# Patient Record
Sex: Male | Born: 1975 | Race: White | Hispanic: No | Marital: Married | Smoking: Current every day smoker
Health system: Southern US, Community
[De-identification: ages and names within clinical notes are randomized; demographics above are authoritative.]

## PROBLEM LIST (undated history)

## (undated) DIAGNOSIS — J9819 Other pulmonary collapse: Secondary | ICD-10-CM

## (undated) HISTORY — PX: APPENDECTOMY: SHX54

## (undated) HISTORY — PX: KNEE SURGERY: SHX244

## (undated) HISTORY — PX: JOINT REPLACEMENT: SHX530

---

## 2004-03-19 ENCOUNTER — Emergency Department (HOSPITAL_COMMUNITY): Admission: EM | Admit: 2004-03-19 | Discharge: 2004-03-19 | Payer: Self-pay | Admitting: Emergency Medicine

## 2004-03-24 ENCOUNTER — Emergency Department (HOSPITAL_COMMUNITY): Admission: EM | Admit: 2004-03-24 | Discharge: 2004-03-24 | Payer: Self-pay | Admitting: Emergency Medicine

## 2004-03-26 ENCOUNTER — Emergency Department (HOSPITAL_COMMUNITY): Admission: EM | Admit: 2004-03-26 | Discharge: 2004-03-26 | Payer: Self-pay | Admitting: Emergency Medicine

## 2004-03-27 ENCOUNTER — Emergency Department (HOSPITAL_COMMUNITY): Admission: EM | Admit: 2004-03-27 | Discharge: 2004-03-27 | Payer: Self-pay

## 2004-04-06 ENCOUNTER — Emergency Department (HOSPITAL_COMMUNITY): Admission: EM | Admit: 2004-04-06 | Discharge: 2004-04-06 | Payer: Self-pay | Admitting: Emergency Medicine

## 2004-12-30 ENCOUNTER — Emergency Department (HOSPITAL_COMMUNITY): Admission: EM | Admit: 2004-12-30 | Discharge: 2004-12-30 | Payer: Self-pay | Admitting: Emergency Medicine

## 2005-02-05 ENCOUNTER — Emergency Department (HOSPITAL_COMMUNITY): Admission: EM | Admit: 2005-02-05 | Discharge: 2005-02-05 | Payer: Self-pay | Admitting: Emergency Medicine

## 2005-02-07 ENCOUNTER — Emergency Department (HOSPITAL_COMMUNITY): Admission: EM | Admit: 2005-02-07 | Discharge: 2005-02-07 | Payer: Self-pay | Admitting: Family Medicine

## 2007-04-12 ENCOUNTER — Emergency Department (HOSPITAL_COMMUNITY): Admission: EM | Admit: 2007-04-12 | Discharge: 2007-04-12 | Payer: Self-pay | Admitting: Emergency Medicine

## 2007-04-22 ENCOUNTER — Emergency Department (HOSPITAL_COMMUNITY): Admission: EM | Admit: 2007-04-22 | Discharge: 2007-04-22 | Payer: Self-pay | Admitting: Emergency Medicine

## 2008-11-27 ENCOUNTER — Ambulatory Visit: Payer: Self-pay | Admitting: Family Medicine

## 2008-11-27 DIAGNOSIS — L509 Urticaria, unspecified: Secondary | ICD-10-CM | POA: Insufficient documentation

## 2008-11-30 ENCOUNTER — Emergency Department (HOSPITAL_COMMUNITY): Admission: EM | Admit: 2008-11-30 | Discharge: 2008-11-30 | Payer: Self-pay | Admitting: Family Medicine

## 2008-11-30 LAB — CONVERTED CEMR LAB
ALT: 15 units/L (ref 0–53)
Alkaline Phosphatase: 61 units/L (ref 39–117)
Basophils Absolute: 0 10*3/uL (ref 0.0–0.1)
Basophils Relative: 0 % (ref 0–1)
MCHC: 32.9 g/dL (ref 30.0–36.0)
Neutro Abs: 6.8 10*3/uL (ref 1.7–7.7)
Neutrophils Relative %: 54 % (ref 43–77)
Platelets: 306 10*3/uL (ref 150–400)
RDW: 13.8 % (ref 11.5–15.5)
Sodium: 141 meq/L (ref 135–145)
Total Bilirubin: 0.3 mg/dL (ref 0.3–1.2)
Total Protein: 6.7 g/dL (ref 6.0–8.3)

## 2009-07-26 ENCOUNTER — Ambulatory Visit: Payer: Self-pay | Admitting: Emergency Medicine

## 2009-07-26 DIAGNOSIS — IMO0002 Reserved for concepts with insufficient information to code with codable children: Secondary | ICD-10-CM | POA: Insufficient documentation

## 2009-07-26 DIAGNOSIS — M5380 Other specified dorsopathies, site unspecified: Secondary | ICD-10-CM | POA: Insufficient documentation
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
## 2010-04-26 LAB — POCT RAPID STREP A (OFFICE): Streptococcus, Group A Screen (Direct): NEGATIVE

## 2010-07-05 ENCOUNTER — Other Ambulatory Visit: Payer: Self-pay | Admitting: Orthopedic Surgery

## 2010-07-05 ENCOUNTER — Encounter (HOSPITAL_COMMUNITY): Payer: BC Managed Care – PPO

## 2010-07-05 LAB — SURGICAL PCR SCREEN: Staphylococcus aureus: NEGATIVE

## 2010-07-05 LAB — COMPREHENSIVE METABOLIC PANEL
ALT: 18 U/L (ref 0–53)
AST: 14 U/L (ref 0–37)
Albumin: 4.2 g/dL (ref 3.5–5.2)
Alkaline Phosphatase: 89 U/L (ref 39–117)
Calcium: 9.6 mg/dL (ref 8.4–10.5)
Potassium: 3.9 mEq/L (ref 3.5–5.1)
Sodium: 139 mEq/L (ref 135–145)
Total Protein: 6.7 g/dL (ref 6.0–8.3)

## 2010-07-05 LAB — CBC
HCT: 45.5 % (ref 39.0–52.0)
Hemoglobin: 15.1 g/dL (ref 13.0–17.0)
MCHC: 33.2 g/dL (ref 30.0–36.0)
RBC: 4.84 MIL/uL (ref 4.22–5.81)

## 2010-07-05 LAB — URINALYSIS, ROUTINE W REFLEX MICROSCOPIC
Glucose, UA: NEGATIVE mg/dL
Hgb urine dipstick: NEGATIVE
Protein, ur: NEGATIVE mg/dL
Specific Gravity, Urine: 1.014 (ref 1.005–1.030)
pH: 6 (ref 5.0–8.0)

## 2010-07-05 LAB — DIFFERENTIAL
Basophils Absolute: 0 10*3/uL (ref 0.0–0.1)
Lymphocytes Relative: 17 % (ref 12–46)
Monocytes Absolute: 0.9 10*3/uL (ref 0.1–1.0)
Neutro Abs: 9.2 10*3/uL — ABNORMAL HIGH (ref 1.7–7.7)
Neutrophils Relative %: 73 % (ref 43–77)

## 2010-07-05 LAB — APTT: aPTT: 29 seconds (ref 24–37)

## 2010-07-05 LAB — PROTIME-INR
INR: 0.96 (ref 0.00–1.49)
Prothrombin Time: 13 seconds (ref 11.6–15.2)

## 2010-07-06 ENCOUNTER — Inpatient Hospital Stay (HOSPITAL_COMMUNITY)
Admission: RE | Admit: 2010-07-06 | Discharge: 2010-07-08 | DRG: 209 | Disposition: A | Discharge status: Home Health | Payer: BC Managed Care – PPO | Source: Ambulatory Visit | Attending: Orthopedic Surgery | Admitting: Orthopedic Surgery

## 2010-07-06 DIAGNOSIS — Z9889 Other specified postprocedural states: Secondary | ICD-10-CM

## 2010-07-06 DIAGNOSIS — F411 Generalized anxiety disorder: Secondary | ICD-10-CM | POA: Diagnosis present

## 2010-07-06 DIAGNOSIS — D62 Acute posthemorrhagic anemia: Secondary | ICD-10-CM | POA: Diagnosis not present

## 2010-07-06 DIAGNOSIS — M12569 Traumatic arthropathy, unspecified knee: Principal | ICD-10-CM | POA: Diagnosis present

## 2010-07-06 DIAGNOSIS — F17200 Nicotine dependence, unspecified, uncomplicated: Secondary | ICD-10-CM | POA: Diagnosis present

## 2010-07-06 DIAGNOSIS — Z01812 Encounter for preprocedural laboratory examination: Secondary | ICD-10-CM

## 2010-07-06 DIAGNOSIS — K219 Gastro-esophageal reflux disease without esophagitis: Secondary | ICD-10-CM | POA: Diagnosis present

## 2010-07-06 DIAGNOSIS — M948X9 Other specified disorders of cartilage, unspecified sites: Secondary | ICD-10-CM | POA: Diagnosis present

## 2010-07-07 DIAGNOSIS — M79609 Pain in unspecified limb: Secondary | ICD-10-CM

## 2010-07-07 LAB — BASIC METABOLIC PANEL
BUN: 5 mg/dL — ABNORMAL LOW (ref 6–23)
CO2: 29 mEq/L (ref 19–32)
Chloride: 101 mEq/L (ref 96–112)
Creatinine, Ser: 0.79 mg/dL (ref 0.50–1.35)
GFR calc Af Amer: >60 mL/min (ref >60)
Potassium: 3.7 mEq/L (ref 3.5–5.1)

## 2010-07-07 LAB — CBC
HCT: 33.6 % — ABNORMAL LOW (ref 39.0–52.0)
MCV: 96.3 fL (ref 78.0–100.0)
RBC: 3.49 MIL/uL — ABNORMAL LOW (ref 4.22–5.81)
WBC: 14 10*3/uL — ABNORMAL HIGH (ref 4.0–10.5)

## 2010-07-08 LAB — CBC
HCT: 33.2 % — ABNORMAL LOW (ref 39.0–52.0)
Hemoglobin: 10.9 g/dL — ABNORMAL LOW (ref 13.0–17.0)
MCHC: 32.8 g/dL (ref 30.0–36.0)
MCV: 96.5 fL (ref 78.0–100.0)
RDW: 13.2 % (ref 11.5–15.5)
WBC: 11.9 10*3/uL — ABNORMAL HIGH (ref 4.0–10.5)

## 2010-07-08 LAB — BASIC METABOLIC PANEL
BUN: 6 mg/dL (ref 6–23)
Chloride: 101 mEq/L (ref 96–112)
Creatinine, Ser: 0.87 mg/dL (ref 0.50–1.35)
GFR calc Af Amer: >60 mL/min (ref >60)
Glucose, Bld: 93 mg/dL (ref 70–99)
Potassium: 4.7 mEq/L (ref 3.5–5.1)

## 2010-07-09 LAB — CROSSMATCH: Unit division: 0

## 2010-08-09 NOTE — Op Note (Signed)
NAME:  ROSCOE, WITTS NO.:  [id number]  MEDICAL RECORD NO.:  [id number]  LOCATION:  1621                         FACILITY:  [facility]  PHYSICIAN:  Trentan Trippe L. Rendall, M.D.  DATE OF BIRTH:  [birth year]  DATE OF PROCEDURE:  07/06/2010 DATE OF DISCHARGE:                              OPERATIVE REPORT   PREOPERATIVE DIAGNOSIS:  Osteoarthritis, left knee status post seven procedures.  SURGICAL PROCEDURES:  Left total knee arthroplasty with computer navigation assistance plus removal of one metal screw, 2 bile screws and significant old surgical debris.  POSTOPERATIVE DIAGNOSIS:  Osteoarthritis, left knee status post seven procedures.  SURGEON:  Samyrah Bruster L. Rendall, M.D.  ASSISTANT:  Legrand Pitts. Duffy, P.A.C - present and participating in entire procedure.  PATHOLOGY:  The patient has had 7 surgical procedures on his left knee including 2 ACL reconstructions and a meniscal allograft at the medial side of his knee.  He has pain that is an 8 or 9 out of 10 requiring significant narcotic use.  Recent x-ray show bone against bone medial compartment.  The meniscal allograft is compressed and is not holding the bones apart.  At surgery, there is grade 3 changes with virtual bone against bone medially and grade 2 and 3 changes on the lateral femoral condyle and patella with chronic inflammation throughout the knee.  In addition in placement of the bone tunnels and so forth one metal screw was removed from the tibial metaphysis and 2 bile screws along with at least a dozen Tycron sutures used for either ACL reconstruction or meniscal allograft or any combination thereof  PROCEDURE:  Under general anesthesia with femoral nerve block, the left leg was prepared with DuraPrep and draped in a sterile field.  Sterile tourniquet was applied proximally using the tourniquet at 350 mmHg after wrapping out the leg with an Esmarch.  Midline incision was made.  The patella was everted.  The  knee is debrided in preparation for computer navigation.  The above findings were noted.  Two Schanz pins were then placed through punctures in the medial tibial metaphyseal region, two in the distal femur within the incision.  The arrays were set up.  The femoral head and malleoli were identified.  Proximal tibia and distal femur mapping were then done.  With the arrays in place, proximal tibial resection was then done resecting the proximal tibia within 1 degree of anatomic accuracy.  The tensioner was then inserted to assure balance ligaments and these were obtained within 1 degree of anatomic accuracy. The flexion gap was then measured.  With this done, the first femoral guide was used for anterior and posterior flare of the distal femur sized at a standard plus.  The distal femoral cut was then made. Flexion and extension gaps were balanced at approximately 10 mm.  Once these 2 cuts were done, the lamina spreader was inserted.  Remnants of the menisci including the allograft which was well fixed but just compressed, remnants of the menisci were debrided and spurs were taken off the back femoral condyle along with the cruciate ligaments.  The recessing guide was then used for a standard plus.  Attention was then turned to the tibia.  It was sized as a #4.  Center peg hole for an MBT bearing was inserted.  The drill went down three-quarters of the way where it encountered metal screw.  We had palpated the medial tibial metaphysis feeling for screw head, thus we exposed the proximal tibia medially and could not find it; however, considerable number of Tycron sutures were removed.  At this point the tibial tray was removed, the screw was palpated.  The incision was carried down another 1 inch in the midline and the screw head was identified just medial to the tibial spine, it was then removed.  In addition down the same hole in the tibia, 2 bile screws were found, they were taken loose  and removed.  The completion of drilling for a mobile bearing tray revision tray was then completed.  The final seating was excellent.  Attention was then turned to putting all the parts together and testing the prosthesis.  The standard plus bearing 10 was used followed by 12.5 followed by 15.  With the 15, the knee in flexion would kick the femoral component off the femur.  This was felt to ultimately be a result of components being too tight posterior medially.  Attention was turned to this.  Further bone was taken off the back of the femoral condyle.  Further trimming of bone was done trying to get a better fit.  Ultimately this did not seem to work and decision was made to downsize the femoral component which was at the upper limits of good as it fit on the end of the femur very well but was just slightly broad at the flare of the femur just in front of the weightbearing area.  Downsizing required also downsizing of the tibial tray to a #3 and with a downsize to a #3 tibia, a 17.5 bearing gave optimal fit with the standard femoral component.  A standard patellar button was then used.  At this point all components fit well. The leg was within 1 degree of anatomic alignment and flexed 120+ degrees.  Permanent components were then obtained.  Bony surfaces prepared with pulse irrigation.  All components were then cemented in place.  The excess cement was removed.  Once it hardened and tourniquet was let down at approximately 90 minutes, multiple small vessels were cauterized.  The knee was then closed in layers with #1 Tycron, #1 Vicryl, 2-0 Vicryl and skin clips.  Operative time, approximately an hour and 45 minutes.  The patient tolerated the procedure well and returned to recovery in good condition.     Braxtin Bamba L. Priscille Kluver, M.D.     Renato Gails  D:  07/06/2010  T:  07/06/2010  Job:  [id number]  Electronically Signed by Erasmo Leventhal M.D. on 08/09/2010 09:59:29 AM
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
## 2010-10-16 LAB — D-DIMER, QUANTITATIVE: D-Dimer, Quant: <0.22

## 2010-10-16 LAB — SEDIMENTATION RATE: Sed Rate: 2

## 2010-11-02 ENCOUNTER — Inpatient Hospital Stay (INDEPENDENT_AMBULATORY_CARE_PROVIDER_SITE_OTHER)
Admission: RE | Admit: 2010-11-02 | Discharge: 2010-11-02 | Disposition: A | Discharge status: Home / Self Care | Payer: BC Managed Care – PPO | Source: Ambulatory Visit | Attending: Emergency Medicine | Admitting: Emergency Medicine

## 2010-11-02 ENCOUNTER — Encounter: Payer: Self-pay | Admitting: Emergency Medicine

## 2010-11-02 DIAGNOSIS — J301 Allergic rhinitis due to pollen: Secondary | ICD-10-CM

## 2010-11-02 DIAGNOSIS — G43009 Migraine without aura, not intractable, without status migrainosus: Secondary | ICD-10-CM | POA: Insufficient documentation

## 2010-12-25 NOTE — Progress Notes (Signed)
Summary: SINUS ISSUES,NAUSEA,VOMITING...WSE   Vital Signs:  Patient Profile:   35 Years Old Male CC:      Migraine with nausea/sinuses x 5 days Height:     69 inches Weight:      161 pounds O2 Sat:      99 % O2 treatment:    Room Air Temp:     98.6 degrees F oral Pulse rate:   78 / minute Pulse rhythm:   regular Resp:     16 per minute BP sitting:   152 / 82  (left arm) Cuff size:   regular  Vitals Entered By: Emilio Math (November 02, 2010 1:13 PM)                  Current Allergies: No known allergies History of Present Illness History from: patient Chief Complaint: Migraine with nausea/sinuses x 5 days History of Present Illness: Pt complains of  5 days of bilateral frontal-maxillary-parietal headache, worse on the left, with sinus congestion. No colored mucus. He feels this is similar to a migraine he had 1 year ago, because it feels the same, has + photophobia, nausea. Had vom x 3 yesterday, none today. Pain 6/10. dull. Not the worst headache of his life. No associated neuro sxs. No blurry vision. No syncope. Tried Zyrttec with minimal help. Tylenol no help.EXCEDRIN MIGRAINE: no help In the past, "Zofran gives me a headache" No sore throat. No cough, No dyspnea. No chest pain. No wheezing.  No fever, No chills   Current Meds EXCEDRIN MIGRAINE 250-250-65 MG TABS (ASPIRIN-ACETAMINOPHEN-CAFFEINE)  ZYRTEC ALLERGY 10 MG CAPS (CETIRIZINE HCL)  PROMETHAZINE HCL 25 MG TABS (PROMETHAZINE HCL) 1 by mouth q4 to 6hr as needed nausea FLUTICASONE PROPIONATE 50 MCG/ACT SUSP (FLUTICASONE PROPIONATE) 2 sprays in each nostril once daily IMITREX 50 MG TABS (SUMATRIPTAN SUCCINATE) 1 by mouth stat as needed for migraine. May repeat X 1 after 2 hours if needed.  REVIEW OF SYSTEMS Constitutional Symptoms      Denies fever, chills, night sweats, weight loss, weight gain, and fatigue.  Eyes       Complains of eye pain.      Denies change in vision, eye discharge, glasses, contact  lenses, and eye surgery. Ear/Nose/Throat/Mouth       Complains of frequent runny nose and sinus problems.      Denies hearing loss/aids, change in hearing, ear pain, ear discharge, dizziness, frequent nose bleeds, sore throat, hoarseness, and tooth pain or bleeding.  Respiratory       Denies dry cough, productive cough, wheezing, shortness of breath, asthma, bronchitis, and emphysema/COPD.  Cardiovascular       Denies murmurs, chest pain, and tires easily with exhertion.    Gastrointestinal       Complains of nausea/vomiting.      Denies stomach pain, diarrhea, constipation, blood in bowel movements, and indigestion. Genitourniary       Denies painful urination, kidney stones, and loss of urinary control. Neurological       Complains of headaches.      Denies paralysis, seizures, and fainting/blackouts. Musculoskeletal       Denies muscle pain, joint pain, joint stiffness, decreased range of motion, redness, swelling, muscle weakness, and gout.  Skin       Denies bruising, unusual mles/lumps or sores, and hair/skin or nail changes.  Psych       Denies mood changes, temper/anger issues, anxiety/stress, speech problems, depression, and sleep problems.  Past History:  Past Medical History: Reviewed  history from 11/27/2008 and no changes required. Unremarkable  Past Surgical History: Reviewed history from 11/27/2008 and no changes required. Appendectomy 5 Knee sugeries  Family History: Reviewed history from 11/27/2008 and no changes required. Mother, Healthy Father, Healthy  Social History: Reviewed history from 11/27/2008 and no changes required. 1 ppd , smoker, 15 yrs ETOH-yes No drugs [place] Physical Exam General appearance: well developed, well nourished, no acute distress. Uncomfortable from HA. + photophobic. Head: normocephalic, atraumatic, Eyes: conjunctivae and lids normal Pupils: equal, round, reactive to light. Fundi wnl. Ears: normal, no lesions or  deformities. Normal tm's Nasal: swollen red turbinates with congestion. No discharge. Oral/Pharynx: tongue normal, posterior pharynx without erythema or exudate Neck: neck supple,  trachea midline, no masses Chest/Lungs: no rales, wheezes, or rhonchi bilateral, breath sounds equal without effort Heart: regular rate and  rhythm, no murmur Extremities: normal extremities Neurological: cranial nerve II-XII grossly intact and non-focal. Motor, sensory, DTR's wnl. 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 May repeat X 1 after 2 hours if needed.  #4 x 0, 11/02/2010, Lajean Manes MD FLUTICASONE PROPIONATE 50 MCG/ACT SUSP (FLUTICASONE PROPIONATE) 2 sprays in each nostril once daily  #One x 0, 11/02/2010, Lajean Manes MD PROMETHAZINE HCL 25 MG TABS (PROMETHAZINE HCL) 1 by mouth q4 to 6hr as needed nausea  #12 x 0, 11/02/2010, Lajean Manes MD  New Orders: Ketorolac-Toradol 15mg  [Z6109] Promethazine up to 50mg  [J2550] Est. Patient Level IV [60454] Admin of Therapeutic Inj  intramuscular or subcutaneous [96372] Planning Comments:   Toradol 60 mg  and Phenergan 50 mg IM. A family member came to drive him home. Rx's for by mouth phenergan (as needed nausea), Flonase for sinus allergies and sinus pressure, and Imitrex, with precautions explained and discussed prior to injections. Risks, benefits, alternatives discussed. Pt voiced understanding and agreement.  Follow Up: Follow up in 1-2 days if no improvement, Follow up with Primary Physician Follow Up: Neurologist in 1 week if headaches persist. F/U to a hospital ED if severe headaches persist or recrr or if new or worsening sxs.  The patient and/or caregiver has been counseled thoroughly with  regard to medications prescribed including dosage, schedule, interactions, rationale for use, and possible side effects and they verbalize understanding.  Diagnoses and expected course of recovery discussed and will return if not improved as expected or if the condition worsens. Patient and/or caregiver verbalized understanding.  Prescriptions: IMITREX 50 MG TABS (SUMATRIPTAN SUCCINATE) 1 by mouth stat as needed for migraine. May repeat X 1 after 2 hours if needed.  #4 x 0   Entered and Authorized by:   Lajean Manes MD   Signed by:   Lajean Manes MD on 11/02/2010   Method used:   Print then Give to Patient   RxID:   [id number] FLUTICASONE PROPIONATE 50 MCG/ACT SUSP (FLUTICASONE PROPIONATE) 2 sprays in each nostril once daily  #One x 0   Entered and Authorized by:   Lajean Manes MD   Signed by:   Lajean Manes MD on 11/02/2010   Method used:   Print then Give to Patient   RxID:   [id number] PROMETHAZINE HCL 25 MG TABS (PROMETHAZINE HCL) 1 by mouth q4 to 6hr as needed nausea  #12 x 0   Entered and Authorized by:  Lajean Manes MD   Signed by:   Lajean Manes MD on 11/02/2010   Method used:   Print then Give to Patient   RxID:   [id number]   Medication Administration  Injection # 1:    Medication: Ketorolac-Toradol 15mg     Diagnosis: 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.10)    Route: IM    Site: LUOQ gluteus    Exp Date: 04/22/2012    Lot #: [id number]    Mfr: Hospira    Comments: 60mg s given    Patient tolerated injection without complications    Given by: Emilio Math (November 02, 2010 2:37 PM)  Injection # 2:    Medication: Promethazine up to 50mg     Diagnosis: 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 Patient Level IV [40102] 4)  Admin of Therapeutic Inj  intramuscular or subcutaneous [72536]

## 2011-02-06 ENCOUNTER — Ambulatory Visit: Payer: BC Managed Care – PPO | Admitting: Physical Medicine & Rehabilitation

## 2011-03-31 ENCOUNTER — Emergency Department
Admission: EM | Admit: 2011-03-31 | Discharge: 2011-03-31 | Disposition: A | Discharge status: Home / Self Care | Payer: BC Managed Care – PPO | Source: Home / Self Care | Attending: Emergency Medicine | Admitting: Emergency Medicine

## 2011-03-31 DIAGNOSIS — F419 Anxiety disorder, unspecified: Secondary | ICD-10-CM

## 2011-03-31 DIAGNOSIS — J069 Acute upper respiratory infection, unspecified: Secondary | ICD-10-CM

## 2011-03-31 DIAGNOSIS — J029 Acute pharyngitis, unspecified: Secondary | ICD-10-CM

## 2011-03-31 DIAGNOSIS — F411 Generalized anxiety disorder: Secondary | ICD-10-CM

## 2011-03-31 LAB — POCT RAPID STREP A (OFFICE): Rapid Strep A Screen: NEGATIVE

## 2011-03-31 MED ORDER — PREDNISONE (PAK) 10 MG PO TABS: 10.0000 mg | ORAL_TABLET | Freq: Every day | ORAL | Status: AC

## 2011-03-31 MED ORDER — ALPRAZOLAM 0.25 MG PO TABS: 0.2500 mg | ORAL_TABLET | Freq: Every evening | ORAL | Status: AC | PRN

## 2011-03-31 NOTE — ED Notes (Signed)
Cough and sore throat started this am

## 2011-03-31 NOTE — ED Provider Notes (Signed)
History     CSN: [id number]  Arrival date & time 03/31/11  1119   First MD Initiated Contact with Patient 03/31/11 1148      Chief Complaint  Patient presents with  . Cough    (Consider location/radiation/quality/duration/timing/severity/associated sxs/prior treatment) HPI Clifford Miller is a 36 y.o. male who complains of onset of cold symptoms for 2  days. +Sick contacts in his family. ++ sore throat + cough No pleuritic pain No wheezing + nasal congestion +post-nasal drainage + sinus pain/pressure No chest congestion No itchy/red eyes No earache No hemoptysis No SOB No chills/sweats No fever No nausea No vomiting No abdominal pain No diarrhea No skin rashes No fatigue No myalgias No headache   2) the patient is preparing to fly to [place] for business and will be there for 5 days. 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 He is asking if he can have a prescription. He states that he normally only uses it for flying and occasionally for very stressful situations.   History reviewed. No pertinent past medical history.  Past Surgical History  Procedure Date  . Knee surgery     x8- total knee    Family History  Problem Relation Age of Onset  . Cancer Other     History  Substance Use Topics  . Smoking status: Current Everyday Smoker  . Smokeless tobacco: Not on file  . Alcohol Use: Yes      Review of Systems  Allergies  Review of patient's allergies indicates no known allergies.  Home Medications   Current Outpatient Rx  Name Route Sig Dispense Refill  . ALPRAZOLAM 0.25 MG PO TABS Oral Take 1 tablet (0.25 mg total) by mouth at bedtime as needed for sleep. 10 tablet 0  . PREDNISONE (PAK) 10 MG PO TABS Oral Take 1 tablet (10 mg total) by mouth daily. 6 day pack, use as directed, Disp 1 pack 1 tablet 0    BP 116/82  Temp(Src)  98.1 F (36.7 C) (Oral)  Resp 20  Ht 5\' 9"  (1.753 m)  Wt 160 lb 4 oz (72.689 kg)  BMI 23.66 kg/m2  SpO2 98%  Physical Exam  ED Course  Procedures (including critical care time)   Labs Reviewed  POCT RAPID STREP A (OFFICE)  STREP A DNA PROBE   No results found.   1. Acute pharyngitis   2. Acute upper respiratory infections of unspecified site   3. Anxiety       MDM  1)  no antibiotic was prescribed today. A throat culture is pending. I gave him prescription for prednisone instead. Use nasal saline solution (over the counter) at least 3 times a day. Use over the counter decongestants like Zyrtec-D every 12 hours as needed to help with congestion.  If you have hypertension, do not take medicines with sudafed.  Can take tylenol every 6 hours or motrin every 8 hours for pain or fever. Follow up with your primary doctor if no improvement in 5-7 days, sooner if increasing pain, fever, or new symptoms.    2) I informed patient that we normally do not treat anxiety, however I would not like him to be carrying another's persons prescription for international travel since it may be confiscated. Because of that I gave him a prescription for Xanax 0.25 mg to be used specifically for flying if necessary. However  he'll need to get any refills from his primary care doctor.  Marlaine Hind, MD 03/31/11 [phone]

## 2011-04-01 LAB — STREP A DNA PROBE: GASP: NEGATIVE

## 2011-04-11 ENCOUNTER — Emergency Department
Admit: 2011-04-11 | Discharge: 2011-04-11 | Disposition: A | Discharge status: Home / Self Care | Payer: BC Managed Care – PPO

## 2011-04-11 ENCOUNTER — Encounter: Payer: Self-pay | Admitting: *Deleted

## 2011-04-11 ENCOUNTER — Emergency Department
Admission: EM | Admit: 2011-04-11 | Discharge: 2011-04-11 | Disposition: A | Discharge status: Home / Self Care | Payer: BC Managed Care – PPO | Source: Home / Self Care | Attending: Emergency Medicine | Admitting: Emergency Medicine

## 2011-04-11 DIAGNOSIS — J209 Acute bronchitis, unspecified: Secondary | ICD-10-CM

## 2011-04-11 DIAGNOSIS — R05 Cough: Secondary | ICD-10-CM

## 2011-04-11 DIAGNOSIS — R059 Cough, unspecified: Secondary | ICD-10-CM

## 2011-04-11 HISTORY — DX: Other pulmonary collapse: J98.19

## 2011-04-11 MED ORDER — HYDROCODONE-HOMATROPINE 5-1.5 MG/5ML PO SYRP: 5.0000 mL | ORAL_SOLUTION | Freq: Four times a day (QID) | ORAL | Status: AC | PRN

## 2011-04-11 MED ORDER — FEXOFENADINE-PSEUDOEPHED ER 60-120 MG PO TB12: 1.0000 | ORAL_TABLET | Freq: Two times a day (BID) | ORAL | Status: DC

## 2011-04-11 MED ORDER — CLARITHROMYCIN 500 MG PO TABS: 500.0000 mg | ORAL_TABLET | Freq: Two times a day (BID) | ORAL | Status: AC

## 2011-04-11 NOTE — ED Provider Notes (Signed)
History     CSN: [id number]  Arrival date & time 04/11/11  1125   First MD Initiated Contact with Patient 04/11/11 1146      Chief Complaint  Patient presents with  . Cough  . Sore Throat    (Consider location/radiation/quality/duration/timing/severity/associated sxs/prior treatment) HPI Clayborne is a 36 y.o. male who complains of onset of cold symptoms for 10 days. He was here about 10 days ago for similar symptoms and had a negative strep test and a negative culture. He was given prednisone which did not help very much. He then flew to [place] for his job and states that he was around a lot of sick people. In addition he has not been sleeping very much secondary to jet lag. He has not been using any medications. He thinks that since the last visit he is getting a little bit worse. He is concerned that he may have developed walking pneumonia while in [place].   + sore throat + cough No pleuritic pain + wheezing + nasal congestion + post-nasal drainage + sinus pain/pressure + chest congestion No itchy/red eyes No earache No hemoptysis No SOB No chills/sweats No fever No nausea No vomiting No abdominal pain No diarrhea No skin rashes + fatigue + myalgias No headache    Past Medical History  Diagnosis Date  . Collapsed lung     Past Surgical History  Procedure Date  . Knee surgery     x8- total knee    Family History  Problem Relation Age of Onset  . Cancer Other     History  Substance Use Topics  . Smoking status: Current Everyday Smoker -- 18 years    Types: Cigarettes  . Smokeless tobacco: Not on file  . Alcohol Use: Yes      Review of Systems  All other systems reviewed and are negative.    Allergies  Review of patient's allergies indicates no known allergies.  Home Medications   Current Outpatient Rx  Name Route Sig Dispense Refill  . ALPRAZOLAM 0.25 MG PO TABS Oral Take 1 tablet (0.25 mg total) by mouth at bedtime as needed for sleep. 10  tablet 0  . CLARITHROMYCIN 500 MG PO TABS Oral Take 1 tablet (500 mg total) by mouth 2 (two) times daily. 20 tablet 0  . FEXOFENADINE-PSEUDOEPHED ER 60-120 MG PO TB12 Oral Take 1 tablet by mouth every 12 (twelve) hours. 30 tablet 0  . HYDROCODONE-HOMATROPINE 5-1.5 MG/5ML PO SYRP Oral Take 5 mLs by mouth every 6 (six) hours as needed for cough. 120 mL 0  . PREDNISONE (PAK) 10 MG PO TABS Oral Take 1 tablet (10 mg total) by mouth daily. 6 day pack, use as directed, Disp 1 pack 1 tablet 0    BP 133/86  Pulse 106  Temp(Src) 98.2 F (36.8 C) (Oral)  Resp 16  Ht 5\' 9"  (1.753 m)  Wt 164 lb 12 oz (74.73 kg)  BMI 24.33 kg/m2  SpO2 100%  Physical Exam  Nursing note and vitals reviewed. Constitutional: He is oriented to person, place, and time. He appears well-developed and well-nourished.  Non-toxic appearance. He does not appear ill.  HENT:  Head: Normocephalic and atraumatic.  Right Ear: Tympanic membrane, external ear and ear canal normal.  Left Ear: Tympanic membrane, external ear and ear canal normal.  Nose: Mucosal edema and rhinorrhea present.  Mouth/Throat: Posterior oropharyngeal erythema present. No oropharyngeal exudate or posterior oropharyngeal edema.  Eyes: No scleral icterus.  Neck: Neck supple.  Cardiovascular:  Regular rhythm and normal heart sounds.   Pulmonary/Chest: Effort normal. No respiratory distress. He has no decreased breath sounds. He has wheezes (Scattered bilateral). He has rhonchi (very mild, scattered bilateral).  Neurological: He is alert and oriented to person, place, and time.  Skin: Skin is warm and dry.  Psychiatric: He has a normal mood and affect. His speech is normal.    ED Course  Procedures (including critical care time)  Labs Reviewed - No data to display Dg Chest 2 View  04/11/2011  *RADIOLOGY REPORT*  Clinical Data: Cough and congestion for 10 days.  Recent prednisone therapy.  Smoker.  CHEST - 2 VIEW  Comparison: 04/22/2007 and 04/12/2007.   Findings: The heart size and mediastinal contours are normal. The lungs are clear. There is no pleural effusion or pneumothorax. No acute osseous findings are identified.  Old fracture of the distal left clavicle appears stable.  IMPRESSION: Stable examination.  No active cardiopulmonary process.  Original Report Authenticated By: Gerrianne Scale, M.D.     1. Cough   2. Acute bronchitis       MDM   An x-ray was ordered and read by the radiologist as above.  Give him a prescription for Biaxin, Hydromet, and Allegra-D. I've advised him to get much rest as possible and to hydrate a since a lack of sleep has likely caused him to to postpone healing.  He should followup with his PCP if not improving in a week. Nasal saline.     Marlaine Hind, MD 04/11/11 [phone]

## 2011-04-11 NOTE — ED Notes (Signed)
Pt complains of sore throat cough body aches chest congestion for 10 days.  Was given Prednisone 10 days ago and has had no relief.  Pt stated he was in [place] last wk around sick people.

## 2011-04-12 ENCOUNTER — Telehealth: Payer: Self-pay | Admitting: *Deleted

## 2011-05-10 ENCOUNTER — Emergency Department (HOSPITAL_BASED_OUTPATIENT_CLINIC_OR_DEPARTMENT_OTHER)
Admission: EM | Admit: 2011-05-10 | Discharge: 2011-05-10 | Disposition: A | Discharge status: Home / Self Care | Payer: BC Managed Care – PPO | Attending: Emergency Medicine | Admitting: Emergency Medicine

## 2011-05-10 ENCOUNTER — Encounter (HOSPITAL_BASED_OUTPATIENT_CLINIC_OR_DEPARTMENT_OTHER): Payer: Self-pay | Admitting: Family Medicine

## 2011-05-10 DIAGNOSIS — M549 Dorsalgia, unspecified: Secondary | ICD-10-CM

## 2011-05-10 DIAGNOSIS — M25569 Pain in unspecified knee: Secondary | ICD-10-CM

## 2011-05-10 DIAGNOSIS — F17200 Nicotine dependence, unspecified, uncomplicated: Secondary | ICD-10-CM | POA: Insufficient documentation

## 2011-05-10 MED ORDER — OXYCODONE-ACETAMINOPHEN 5-325 MG PO TABS
1.0000 | ORAL_TABLET | ORAL | Status: DC | PRN
Start: 2011-05-10 — End: 2011-05-17

## 2011-05-10 NOTE — Discharge Instructions (Signed)

## 2011-05-10 NOTE — ED Provider Notes (Signed)
History     CSN: [id number]  Arrival date & time 05/10/11  4098   First MD Initiated Contact with Patient 05/10/11 [phone]      Chief Complaint  Patient presents with  . Back Pain     Patient is a 36 y.o. male presenting with back pain. The history is provided by the patient.  Back Pain  This is a new problem. The current episode started more than 2 days ago. The problem occurs constantly. The problem has been gradually worsening. The pain is associated with no known injury. The pain is present in the lumbar spine. The quality of the pain is described as stabbing. The pain does not radiate. The pain is moderate. The symptoms are aggravated by twisting, certain positions and bending. The pain is the same all the time. Pertinent negatives include no chest pain, no fever, no numbness, no abdominal pain, no bowel incontinence, no perianal numbness, no bladder incontinence, no paresthesias, no paresis, no tingling and no weakness.  pt reports back pain for past week No trauma but does report heavy lifting at work No focal leg weakness No urinary retention/incontinence reported No dysuria No fever No h/o back surgery He is ambulatory Also reports chronic leg knee pain for months since total knee replacement.  No new swelling/erythema reported.  No new trauma to knee  He reports recent diarrhea, and then noticed dark stool this morning.  He does report recent use of pepto bismol But also uses ibuprofen frequently Past Medical History  Diagnosis Date  . Collapsed lung     Past Surgical History  Procedure Date  . Knee surgery     x8- total knee  . Joint replacement     Family History  Problem Relation Age of Onset  . Cancer Other     History  Substance Use Topics  . Smoking status: Current Everyday Smoker -- 18 years    Types: Cigarettes  . Smokeless tobacco: Not on file  . Alcohol Use: Yes      Review of Systems  Constitutional: Negative for fever.  Cardiovascular:  Negative for chest pain.  Gastrointestinal: Negative for abdominal pain and bowel incontinence.  Genitourinary: Negative for bladder incontinence.  Musculoskeletal: Positive for back pain.  Neurological: Negative for tingling, weakness, numbness and paresthesias.    Allergies  Prednisone  Home Medications   Current Outpatient Rx  Name Route Sig Dispense Refill  . NEXIUM PO Oral Take by mouth.    Marland Kitchen FEXOFENADINE-PSEUDOEPHED ER 60-120 MG PO TB12 Oral Take 1 tablet by mouth every 12 (twelve) hours. 30 tablet 0  . OXYCODONE-ACETAMINOPHEN 5-325 MG PO TABS Oral Take 1 tablet by mouth every 4 (four) hours as needed for pain. 10 tablet 0    BP 130/97  Pulse 89  Temp(Src) 97.9 F (36.6 C) (Oral)  Resp 16  Ht 5\' 9"  (1.753 m)  Wt 160 lb (72.576 kg)  BMI 23.63 kg/m2  SpO2 100%  Physical Exam CONSTITUTIONAL: Well developed/well nourished HEAD AND FACE: Normocephalic/atraumatic EYES: EOMI/PERRL ENMT: Mucous membranes moist NECK: supple no meningeal signs SPINE:lumbar spine tender and paraspinal tenderness.  No bruising/crepitance/stepoffs noted to spine No thoracic tenderness noted CV: S1/S2 noted, no murmurs/rubs/gallops noted LUNGS: Lungs are clear to auscultation bilaterally, no apparent distress ABDOMEN: soft, nontender, no rebound or guarding GU:no cva tenderness Rectal - anal tone normal, stool color normal, no melena, no blood, no masses noted.  Prostate not assessed, chaperone present NEURO:Awake/alert, no saddle anesthesia, rectal tone present (chaperone present),  equal distal motor: hip flexion/knee flexion/extension, ankle dorsi/plantar flexion, great toe extension intact bilaterally, no clonus bilaterally, plantar reflex appropriate, no apparent sensory deficit in any dermatome.  Equal patellar/achilles reflex noted.  Pt is able to ambulate. EXTREMITIES: pulses normal, full ROM.  Mild tenderness to left knee but no erythema/warmth noted.  Full ROM of left knee noted.  Well  healed incision noted SKIN: warm, color normal PSYCH: no abnormalities of mood noted  ED Course  Procedures    Labs Reviewed  OCCULT BLOOD X 1 CARD TO LAB, STOOL     1. Back pain   2. Knee pain       MDM  Nursing notes reviewed and considered in documentation   I suspect back strain, no neuro deficits, doubt uti/pyelo/kidney stone Advised f/u with sports med Knee pain chronic, no acute issue, no signs of septic joint  The patient appears reasonably screened and/or stabilized for discharge and I doubt any other medical condition or other [facility] requiring further screening, evaluation, or treatment in the ED at this time prior to discharge.         Joya Gaskins, MD 05/10/11 1009

## 2011-05-10 NOTE — ED Notes (Signed)
Pt c/o low and mid back pain x 1 wk with h/o same. Pain non-radiating, denies dysuria. Pt denies injury. Pt also c/o left knee pain, h/o knee replacement 1 year ago. Pt ambulatory to room without difficulty.

## 2011-05-17 ENCOUNTER — Encounter: Payer: Self-pay | Admitting: Family Medicine

## 2011-05-17 ENCOUNTER — Ambulatory Visit (INDEPENDENT_AMBULATORY_CARE_PROVIDER_SITE_OTHER): Payer: BC Managed Care – PPO | Admitting: Family Medicine

## 2011-05-17 VITALS — BP 131/85 | HR 100 | Temp 98.0°F | Ht 69.0 in | Wt 160.0 lb

## 2011-05-17 DIAGNOSIS — M25511 Pain in right shoulder: Secondary | ICD-10-CM

## 2011-05-17 DIAGNOSIS — M25519 Pain in unspecified shoulder: Secondary | ICD-10-CM

## 2011-05-17 DIAGNOSIS — M25562 Pain in left knee: Secondary | ICD-10-CM

## 2011-05-17 DIAGNOSIS — M545 Low back pain, unspecified: Secondary | ICD-10-CM

## 2011-05-17 DIAGNOSIS — M25569 Pain in unspecified knee: Secondary | ICD-10-CM

## 2011-05-17 MED ORDER — TRAMADOL HCL 50 MG PO TABS: 50.0000 mg | ORAL_TABLET | Freq: Three times a day (TID) | ORAL | Status: DC | PRN

## 2011-05-17 MED ORDER — METAXALONE 800 MG PO TABS: 800.0000 mg | ORAL_TABLET | Freq: Three times a day (TID) | ORAL | Status: AC | PRN

## 2011-05-17 NOTE — Patient Instructions (Addendum)
You should go back to [facility] (even though Dr. Priscille Kluver has retired) for your left knee pain - sometimes they'll try a postoperative cortisone injection and rehab but this should be decided by them.  You have right rotator cuff impingement Try to avoid painful activities (overhead activities, lifting with extended arm) as much as possible. Ibuprofen as needed for pain. Subacromial injection may be beneficial to help with pain and to decrease inflammation - you were given this today. Start physical therapy with transition to home exercise program. Do home exercise program with theraband and scapular stabilization exercises daily - these are very important for long term relief even if an injection was given. If not improving at follow-up we will consider further imaging (x-rays then MRI).  Your low back pain is likely musculoskeletal but can also be due to a bulging disc These are both treated the same initially. Take tylenol for baseline pain relief (1-2 extra strength tabs 3x/day) Advil as you have been. Tramadol is the strongest pain medicine we prescribe for this - you can also see the pain management physicians you were seeing previously.   Flexeril or skelaxin as needed for muscle spasms (no driving on this medicine if it makes you sleepy). Stay as active as possible. Consider massage, chiropractor, physical therapy, and/or acupuncture. Physical therapy has been shown to be helpful while the others have mixed results. Strengthening of low back muscles, abdominal musculature are key for long term pain relief. If not improving, will consider further imaging (MRI) - typically done if considering back injections, possible surgery. Follow up with me in 1 month for your back and shoulder.

## 2011-05-18 ENCOUNTER — Encounter: Payer: Self-pay | Admitting: Family Medicine

## 2011-05-18 DIAGNOSIS — M545 Low back pain, unspecified: Secondary | ICD-10-CM | POA: Insufficient documentation

## 2011-05-18 DIAGNOSIS — M25562 Pain in left knee: Secondary | ICD-10-CM | POA: Insufficient documentation

## 2011-05-18 DIAGNOSIS — M25511 Pain in right shoulder: Secondary | ICD-10-CM | POA: Insufficient documentation

## 2011-05-18 NOTE — Progress Notes (Signed)
Subjective:    Patient ID: Clifford Miller, male    DOB: [birth year], 36 y.o.   MRN: [id number]  PCP: None  HPI 36 yo M here for multiple complaints.  1. Left knee pain Patient has had multiple surgeries of left knee following a wakeboard accident. 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 Priscille Kluver. Did PT postoperative but states still having pain in knee - feels like catching or a rubber band feeling. Motion is pretty good per pateint. Started seeing pain management physicians in January but would like to do without narcotic pain medication if possible.  2. Right shoulder pain No known injury. Over past week has had worsening pain in right shoulder. Worse with reaching behind, overhead. + night pain. No numbness/tingling. No prior issues with this shoulder.  3. Low back pain No prior issues. States pain is in center of lumbar spine and L > R No bowel/bladder dysfunction. Worse with extension and flexion. No radiation into legs. No numbness or tingling.  Past Medical History  Diagnosis Date  . Collapsed lung     Current Outpatient Prescriptions on File Prior to Visit  Medication Sig Dispense Refill  . Esomeprazole Magnesium (NEXIUM PO) Take by mouth.      . fexofenadine-pseudoephedrine (ALLEGRA-D) 60-120 MG per tablet Take 1 tablet by mouth every 12 (twelve) hours.  30 tablet  0    Past Surgical History  Procedure Date  . Joint replacement   . Knee surgery     x8- total knee  . Appendectomy     Allergies  Allergen Reactions  . Opana   . Prednisone     History   Social History  . Marital Status: Married    Spouse Name: N/A    Number of Children: N/A  . Years of Education: N/A   Occupational History  . Not on file.   Social History Main Topics  . Smoking status: Current Everyday Smoker -- 18 years    Types: Cigarettes  . Smokeless tobacco: Not on file  . Alcohol Use:  Yes  . Drug Use: No  . Sexually Active: Not on file   Other Topics Concern  . Not on file   Social History Narrative  . No narrative on file    Family History  Problem Relation Age of Onset  . Cancer Other   . Sudden death Neg Hx   . Hyperlipidemia Neg Hx   . Heart attack Neg Hx   . Hypertension Neg Hx   . Diabetes Neg Hx     BP 131/85  Pulse 100  Temp(Src) 98 F (36.7 C) (Oral)  Ht 5\' 9"  (1.753 m)  Wt 160 lb (72.576 kg)  BMI 23.63 kg/m2  Review of Systems See HPI above.    Objective:   Physical Exam Gen: NAD  R shoulder: No swelling, ecchymoses.  No gross deformity. No TTP at [facility] or biceps tendon. FROM with painful arc. Positive Hawkins, Neers. Negative Speeds, Yergasons. Strength 5/5 with empty can and resisted internal/external rotation.  Pain with empty can. Negative apprehension. NV intact distally.  Back: No gross deformity, scoliosis. TTP midline and L > R paraspinal regions of lumbar spine but no bony TTP.  No stepoffs. FROM with pain on flexion and extension, equal. Strength LEs 5/5 all muscle groups.   2+ MSRs in right patellar and achilles tendons.  Left patellar absent. Negative SLRs. Sensation intact to light touch bilaterally.  Negative logroll bilateral hips Negative fabers.  L knee: Well healed surgical scar from TKR.  No other deformity, swelling, ecchymoses. Diffuse TTP including at joint lines.  No quad tendon, patellar tendon, or pes TTP. ROM 0 -120 degrees. Negative valgus/varus testing. NV intact distally.     Assessment & Plan:  1. Right shoulder pain - 2/2 rotator cuff impingement.  Start PT and HEP.  Given subacromial injection today.  Ibuprofen as needed for pain.  Consider further imaging if not improving as expected.  After informed written consent, patient was seated on exam table. Right shoulder was prepped with alcohol swab and utilizing posterior approach, patient's right subacromial space was injected with 3:1  marcaine: depomedrol. Patient tolerated the procedure well without immediate complications.  2. Low back pain - likely 2/2 lumbar spasms/strain.  Given rx for tramadol and skelaxin to take as needed.  Start PT for this.  Consider further imaging if not improving as expected.  3. Left knee pain - Advised patient to follow-up with [facility] regarding this issue.  Sometimes they'll try a single postop cortisone injection for possible arthrofibrosis (though motion is not very limited) but would defer this to them.  No bursitis or tendinopathy on exam.

## 2011-05-18 NOTE — Assessment & Plan Note (Signed)
likely 2/2 lumbar spasms/strain.  Given rx for tramadol and skelaxin to take as needed.  Start PT for this.  Consider further imaging if not improving as expected.

## 2011-05-18 NOTE — Assessment & Plan Note (Signed)
Advised patient to follow-up with [facility] regarding this issue.  Sometimes they'll try a single postop cortisone injection for possible arthrofibrosis (though motion is not very limited) but would defer this to them.  No bursitis or tendinopathy on exam.

## 2011-05-18 NOTE — Assessment & Plan Note (Signed)
2/2 rotator cuff impingement.  Start PT and HEP.  Given subacromial injection today.  Ibuprofen as needed for pain.  Consider further imaging if not improving as expected.  After informed written consent, patient was seated on exam table. Right shoulder was prepped with alcohol swab and utilizing posterior approach, patient's right subacromial space was injected with 3:1 marcaine: depomedrol. Patient tolerated the procedure well without immediate complications.

## 2011-06-21 MED ORDER — TRAMADOL HCL 50 MG PO TABS: 50.0000 mg | ORAL_TABLET | Freq: Three times a day (TID) | ORAL | Status: DC | PRN

## 2011-06-21 NOTE — Progress Notes (Signed)
Addended by: Lenda Kelp on: 06/21/2011 01:55 PM   Modules accepted: Orders

## 2011-07-29 ENCOUNTER — Other Ambulatory Visit: Payer: Self-pay | Admitting: Family Medicine

## 2011-08-09 ENCOUNTER — Emergency Department
Admission: EM | Admit: 2011-08-09 | Discharge: 2011-08-09 | Disposition: A | Discharge status: Home / Self Care | Payer: BC Managed Care – PPO | Source: Home / Self Care

## 2011-08-09 ENCOUNTER — Encounter: Payer: Self-pay | Admitting: *Deleted

## 2011-08-09 DIAGNOSIS — R5381 Other malaise: Secondary | ICD-10-CM

## 2011-08-09 DIAGNOSIS — T148 Other injury of unspecified body region: Secondary | ICD-10-CM

## 2011-08-09 DIAGNOSIS — A770 Spotted fever due to Rickettsia rickettsii: Secondary | ICD-10-CM

## 2011-08-09 DIAGNOSIS — W57XXXA Bitten or stung by nonvenomous insect and other nonvenomous arthropods, initial encounter: Secondary | ICD-10-CM

## 2011-08-09 DIAGNOSIS — R5383 Other fatigue: Secondary | ICD-10-CM

## 2011-08-09 LAB — POCT CBC W AUTO DIFF (K'VILLE URGENT CARE)

## 2011-08-09 MED ORDER — DOXYCYCLINE HYCLATE 100 MG PO CAPS: 100.0000 mg | ORAL_CAPSULE | Freq: Two times a day (BID) | ORAL | Status: DC

## 2011-08-09 NOTE — ED Provider Notes (Signed)
History     CSN: [id number]  Arrival date & time 08/09/11  7829   First MD Initiated Contact with Patient 08/09/11 [phone]      Chief Complaint  Patient presents with  . Insect Bite    tick bites   HPI Comments: Pt reports multiple tick bites during camping 4th of July weekend in [place].  Started noticing generalized malaise and fatigue approx 1 week later.  Also with headache.  No fevers, chills.  No joint pain.  No recent illnesses.  No new medications.  Pt also noticed rash at site of last tick removed.  This has been present for last 5 days.  Area is non tender, non pruritic.    Past Medical History  Diagnosis Date  . Collapsed lung     Past Surgical History  Procedure Date  . Joint replacement   . Knee surgery     x8- total knee  . Appendectomy     Family History  Problem Relation Age of Onset  . Cancer Other   . Sudden death Neg Hx   . Hyperlipidemia Neg Hx   . Heart attack Neg Hx   . Hypertension Neg Hx   . Diabetes Neg Hx     History  Substance Use Topics  . Smoking status: Current Everyday Smoker -- 20 years    Types: Cigarettes  . Smokeless tobacco: Not on file  . Alcohol Use: Yes      Review of Systems  Constitutional: Positive for chills and fatigue.  HENT: Negative for hearing loss, nosebleeds, neck pain and neck stiffness.   Eyes: Negative for discharge.  Respiratory: Negative for chest tightness and wheezing.   Cardiovascular: Negative for chest pain.  Gastrointestinal: Positive for nausea.  Musculoskeletal: Negative for myalgias and joint swelling.  Skin: Positive for rash.  Neurological: Positive for headaches. Negative for dizziness, seizures, speech difficulty and numbness.    Allergies  Oxymorphone hcl and Prednisone  Home Medications   Current Outpatient Rx  Name Route Sig Dispense Refill  . CIPROFLOXACIN HCL 0.3 % OP SOLN  1 drop every 2 (two) hours. Administer 1 drop, every 2 hours, while awake, for 2 days. Then 1  drop, every 4 hours, while awake, for the next 5 days.    Marland Kitchen DOXYCYCLINE HYCLATE 100 MG PO CAPS Oral Take 1 capsule (100 mg total) by mouth 2 (two) times daily. 20 capsule 0  . NEXIUM PO Oral Take by mouth.    Marland Kitchen FEXOFENADINE-PSEUDOEPHED ER 60-120 MG PO TB12 Oral Take 1 tablet by mouth every 12 (twelve) hours. 30 tablet 0  . TRAMADOL HCL 50 MG PO TABS Oral Take 1 tablet (50 mg total) by mouth every 8 (eight) hours as needed for pain. 90 tablet 0    BP 129/90  Pulse 88  Temp 98.5 F (36.9 C) (Oral)  Resp 14  Ht 5\' 9"  (1.753 m)  Wt 159 lb (72.122 kg)  BMI 23.48 kg/m2  SpO2 100%  Physical Exam  Constitutional: He is oriented to person, place, and time. He appears well-developed and well-nourished.  HENT:  Head: Normocephalic and atraumatic.  Right Ear: External ear normal.  Left Ear: External ear normal.  Mouth/Throat: Oropharynx is clear and moist.  Eyes: Conjunctivae are normal. Pupils are equal, round, and reactive to light.  Neck: Normal range of motion. Neck supple.  Cardiovascular: Normal rate and regular rhythm.   Pulmonary/Chest: Effort normal and breath sounds normal.  Abdominal: Soft. Bowel sounds are normal.  Musculoskeletal:  Normal range of motion.  Neurological: He is alert and oriented to person, place, and time. No cranial nerve deficit.  Skin: Skin is warm. There is pallor.    ED Course  Procedures (including critical care time)   Labs Reviewed  POCT CBC W AUTO DIFF ([facility])  ROCKY MTN SPOTTED FVR AB, IGG-BLOOD  ROCKY MTN SPOTTED FVR AB, IGM-BLOOD   No results found.   No diagnosis found.    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  CBC with noted leukocytosis at 15.8. Plts stable at 298.  Will treat with doxycycline 100mg  BID x 10 days.  Will check RMSF IGG and IGM. Discussed general infectious and neuro red flags.  Handout given.  Follow up if sxs not improved in 24-48 hours.     The patient and/or caregiver has been  counseled thoroughly with regard to treatment plan and/or medications prescribed including dosage, schedule, interactions, rationale for use, and possible side effects and they verbalize understanding. Diagnoses and expected course of recovery discussed and will return if not improved as expected or if the condition worsens. Patient and/or caregiver verbalized understanding.             Floydene Flock, MD 08/09/11 1010

## 2011-08-09 NOTE — ED Notes (Signed)
Patient reports being bitten by at least 3 ticks. He removed the about 12 days ago. Since, he c/o fatigue and a circular rash on his left side.

## 2011-08-09 NOTE — Discharge Instructions (Signed)
[place] Spotted Fever [place] Spotted Fever (RMSF) is the oldest known tick-borne disease of people in the [place]. This disease was named because it was first described among people in the [facility] area who had an illness characterized by a rash with red-purple-black spots. This disease is caused by a rickettsia (Rickettsia rickettsii), a bacteria carried by the tick. The [facility] wood tick and the American dog tick, acquire and transmit the RMSF bacteria (pictures NOT actual size). When a larval, nymphal or adult tick feeds on an infected rodent or larger animal, the tick can become infected. Infected adult ticks then feed on people who may then get RMSF. The tick transmits the disease to humans during a prolonged period of feeding that lasts many hours, days or even a couple weeks. The bite is painless and frequently goes unnoticed. An infected male tick may also pass the rickettsial bacteria to her eggs that then may mature to be infected adult ticks. The rickettsia that causes RMSF can also get into a person's body through damaged skin. A tick bite is not necessary. People can get RMSF if they crush a tick and get it's blood or body fluids on their skin through a small cut or sore.  DIAGNOSIS Diagnosis is made by laboratory tests.  TREATMENT Treatment is with antibiotics (medications that kill rickettsia and other bacteria). Immediate treatment usually prevents death. GEOGRAPHIC RANGE This disease was reported only in the [facility] until 1931. RMSF has more recently been described among individuals in all states except [place], [place] and [place]. The highest reported incidences of RMSF now occur among residents of [place], [place], [place] and [address]. TIME OF YEAR  Most cases are diagnosed during late spring and summer when ticks are most active. However, especially in the warmer [place] states, a few cases occur during the winter. SYMPTOMS    Symptoms of RMSF begin from 2 to 14 days after a tick bite. The most common early symptoms are fever, muscle aches and headache followed by nausea (feeling sick to your stomach) or vomiting.   The RMSF rash is typically delayed until 3 or more days after symptom onset, and eventually develops in 9 of 10 infected patients by the 5th day of illness.  If the disease is not treated it can cause death. If you get a fever, headache, muscle aches, rash, nausea or vomiting within 2 weeks of a possible tick bite or exposure you should see your caregiver immediately. PREVENTION Ticks prefer to hide in shady, moist ground litter. They can often be found above the ground clinging to tall grass, brush, shrubs and low tree branches. They also inhabit lawns and gardens, especially at the edges of woodlands and around old stone walls. Within the areas where ticks generally live, no naturally vegetated area can be considered completely free of infected ticks. The best precaution against RMSF is to avoid contact with soil, leaf litter and vegetation as much as possible in tick infested areas. For those who enjoy gardening or walking in their yards, clear brush and mow tall grass around houses and at the edges of gardens. This may help reduce the tick population in the immediate area. Applications of chemical insecticides by a licensed professional in the spring (late May) and Fall (September) will also control ticks, especially in heavily infested areas. Treatment will never get rid of all the ticks. Getting rid of small animal populations that host ticks will also decrease the tick population. When working in  the garden, pruning shrubs, or handling soil and vegetation, wear light-colored protective clothing and gloves. Spot-check often to prevent ticks from reaching the skin. Ticks cannot jump or fly. They will not drop from an above-ground perch onto a passing animal. Once a tick gains access to human skin it climbs upward  until it reaches a more protected area. For example, the back of the knee, groin, navel, armpit, ears or nape of the neck. It then begins the slow process of embedding itself in the skin. Campers, hikers, field workers, and others who spend time in wooded, brushy or tall grassy areas can avoid exposure to ticks by using the following precautions:  Wear light-colored clothing with a tight weave to spot ticks more easily and prevent contact with the skin.   Wear long pants tucked into socks, long-sleeved shirts tucked into pants and enclosed shoes or boots along with insect repellent.   Spray clothes with insect repellent containing either DEET or Permethrin. Only DEET can be used on exposed skin. Follow the manufacturer's directions carefully.   Wear a hat and keep long hair pulled back.   Stay on cleared, well-worn trails whenever possible.   Spot-check yourself and others often for the presence of ticks on clothes. If you find one, there are likely to be others. Check thoroughly.   Remove clothes after leaving tick-infested areas. If possible, wash them to eliminate any unseen ticks. Check yourself, your children and any pets from head to toe for the presence of ticks.   Shower and shampoo.  You can greatly reduce your chances of contracting RMSF if you remove attached ticks as soon as possible. Regular checks of the body, including all body sites covered by hair (head, armpits, genitals), allow removal of the tick before rickettsial transmission. To remove an attached tick, use a forceps or tweezers to detach the intact tick without leaving mouth parts in the skin. The tick bite wound should be cleansed after tick removal. Remember the most common symptoms of RMSF are fever, muscle aches, headache and nausea or vomiting with a later onset of rash. If you get these symptoms after a tick bite and while living in an area where RMSF is found, RMSF should be suspected. If the disease is not treated,  it can cause death. See your caregiver immediately if you get these symptoms. Do this even if not aware of a tick bite. Document Released: 04/22/2000 Document Revised: 12/28/2010 Document Reviewed: 12/13/2008 [facility] Patient Information 2012 [place], [place].

## 2011-08-11 LAB — ROCKY MTN SPOTTED FVR AB, IGG-BLOOD: RMSF IgG: 0.33 IV

## 2011-08-11 NOTE — ED Provider Notes (Signed)
Agree with exam, assessment, and plan.   Lattie Haw, MD 08/11/11 [phone]

## 2011-08-13 ENCOUNTER — Emergency Department (HOSPITAL_BASED_OUTPATIENT_CLINIC_OR_DEPARTMENT_OTHER)
Admission: EM | Admit: 2011-08-13 | Discharge: 2011-08-13 | Disposition: A | Discharge status: Home / Self Care | Payer: BC Managed Care – PPO | Attending: Emergency Medicine | Admitting: Emergency Medicine

## 2011-08-13 ENCOUNTER — Encounter (HOSPITAL_BASED_OUTPATIENT_CLINIC_OR_DEPARTMENT_OTHER): Payer: Self-pay

## 2011-08-13 ENCOUNTER — Telehealth: Payer: Self-pay | Admitting: *Deleted

## 2011-08-13 DIAGNOSIS — B349 Viral infection, unspecified: Secondary | ICD-10-CM

## 2011-08-13 DIAGNOSIS — B9789 Other viral agents as the cause of diseases classified elsewhere: Secondary | ICD-10-CM | POA: Insufficient documentation

## 2011-08-13 DIAGNOSIS — F17200 Nicotine dependence, unspecified, uncomplicated: Secondary | ICD-10-CM | POA: Insufficient documentation

## 2011-08-13 DIAGNOSIS — R21 Rash and other nonspecific skin eruption: Secondary | ICD-10-CM | POA: Insufficient documentation

## 2011-08-13 LAB — CBC WITH DIFFERENTIAL/PLATELET
Eosinophils Absolute: 0.1 10*3/uL (ref 0.0–0.7)
Hemoglobin: 15.2 g/dL (ref 13.0–17.0)
Lymphocytes Relative: 22 % (ref 12–46)
Lymphs Abs: 2.1 10*3/uL (ref 0.7–4.0)
MCH: 31.9 pg (ref 26.0–34.0)
Monocytes Relative: 7 % (ref 3–12)
Neutro Abs: 6.5 10*3/uL (ref 1.7–7.7)
Neutrophils Relative %: 69 % (ref 43–77)
Platelets: 293 10*3/uL (ref 150–400)
RBC: 4.77 MIL/uL (ref 4.22–5.81)
WBC: 9.4 10*3/uL (ref 4.0–10.5)

## 2011-08-13 LAB — BASIC METABOLIC PANEL
BUN: 12 mg/dL (ref 6–23)
CO2: 29 mEq/L (ref 19–32)
Chloride: 101 mEq/L (ref 96–112)
Glucose, Bld: 79 mg/dL (ref 70–99)
Potassium: 4.2 mEq/L (ref 3.5–5.1)
Sodium: 140 mEq/L (ref 135–145)

## 2011-08-13 MED ORDER — SODIUM CHLORIDE 0.9 % IV BOLUS (SEPSIS)
1000.0000 mL | Freq: Once | INTRAVENOUS | Status: AC
  Administered 2011-08-13: 1000 mL via INTRAVENOUS

## 2011-08-13 MED ORDER — ONDANSETRON HCL 4 MG/2ML IJ SOLN
4.0000 mg | Freq: Once | INTRAMUSCULAR | Status: AC
  Administered 2011-08-13: 4 mg via INTRAVENOUS
  Filled 2011-08-13: qty 2

## 2011-08-13 MED ORDER — KETOROLAC TROMETHAMINE 30 MG/ML IJ SOLN
30.0000 mg | Freq: Once | INTRAMUSCULAR | Status: AC
  Administered 2011-08-13: 30 mg via INTRAVENOUS
  Filled 2011-08-13: qty 1

## 2011-08-13 MED ORDER — DOXYCYCLINE HYCLATE 100 MG PO CAPS: 100.0000 mg | ORAL_CAPSULE | Freq: Two times a day (BID) | ORAL | Status: AC

## 2011-08-13 MED ORDER — HYDROMORPHONE HCL PF 1 MG/ML IJ SOLN
1.0000 mg | Freq: Once | INTRAMUSCULAR | Status: AC
  Administered 2011-08-13: 1 mg via INTRAVENOUS
  Filled 2011-08-13: qty 1

## 2011-08-13 MED ORDER — OXYCODONE HCL 5 MG PO TABS: 5.0000 mg | ORAL_TABLET | ORAL | Status: AC | PRN

## 2011-08-13 MED ORDER — DIPHENHYDRAMINE HCL 50 MG/ML IJ SOLN
25.0000 mg | Freq: Once | INTRAMUSCULAR | Status: AC
  Administered 2011-08-13: 25 mg via INTRAVENOUS
  Filled 2011-08-13: qty 1

## 2011-08-13 MED ORDER — METOCLOPRAMIDE HCL 5 MG/ML IJ SOLN
10.0000 mg | Freq: Once | INTRAMUSCULAR | Status: AC
  Administered 2011-08-13: 10 mg via INTRAVENOUS
  Filled 2011-08-13: qty 2

## 2011-08-13 NOTE — ED Provider Notes (Signed)
History     CSN: [id number]  Arrival date & time 08/13/11  1208   First MD Initiated Contact with Patient 08/13/11 1223      Chief Complaint  Patient presents with  . Fever  . Headache  . Generalized Body Aches    (Consider location/radiation/quality/duration/timing/severity/associated sxs/prior treatment) HPI Comments: Patient was bitten by several ticks approximately 2 weeks ago in [place]. Patient developed a rash on his left side last week and was seen by his primary care physician. He was started on doxycycline at that time. [facility] spotted fever titer was drawn and was been negative. Patient had an elevated white blood cell count at that time. Patient states that his fever, myalgias, arthralgias, nausea/vomiting, cough and abdominal pain have continued despite treatment. He has been taking doxycycline. Nothing makes symptoms better. Nothing makes symptoms worse. Onset was gradual. Course is persistent.  Patient is a 36 y.o. male presenting with fever. The history is provided by the patient.  Fever Primary symptoms of the febrile illness include fever, fatigue, headaches, cough, abdominal pain, nausea, vomiting, myalgias, arthralgias and rash. Primary symptoms do not include visual change, wheezing, shortness of breath, diarrhea, dysuria or altered mental status. The current episode started 6 to 7 days ago. This is a new problem. The problem has not changed since onset. The headache is not associated with visual change or neck stiffness.  The onset of the illness is associated with animal contact.    Past Medical History  Diagnosis Date  . Collapsed lung     Past Surgical History  Procedure Date  . Joint replacement   . Knee surgery     x8- total knee  . Appendectomy     Family History  Problem Relation Age of Onset  . Cancer Other   . Sudden death Neg Hx   . Hyperlipidemia Neg Hx   . Heart attack Neg Hx   . Hypertension Neg Hx   . Diabetes Neg Hx      History  Substance Use Topics  . Smoking status: Current Everyday Smoker -- 20 years    Types: Cigarettes  . Smokeless tobacco: Not on file  . Alcohol Use: Yes     weekly      Review of Systems  Constitutional: Positive for fever and fatigue.  HENT: Positive for sore throat. Negative for hearing loss, ear pain, congestion, rhinorrhea, neck pain and neck stiffness.   Eyes: Negative for redness.  Respiratory: Positive for cough. Negative for shortness of breath and wheezing.   Cardiovascular: Negative for chest pain.  Gastrointestinal: Positive for nausea, vomiting and abdominal pain. Negative for diarrhea.  Genitourinary: Negative for dysuria.  Musculoskeletal: Positive for myalgias and arthralgias.  Skin: Positive for rash.  Neurological: Positive for headaches.  Psychiatric/Behavioral: Negative for altered mental status.    Allergies  Oxymorphone hcl and Prednisone  Home Medications   Current Outpatient Rx  Name Route Sig Dispense Refill  . CIPROFLOXACIN HCL 0.3 % OP SOLN  1 drop every 2 (two) hours. Administer 1 drop, every 2 hours, while awake, for 2 days. Then 1 drop, every 4 hours, while awake, for the next 5 days.    Marland Kitchen DOXYCYCLINE HYCLATE 100 MG PO CAPS Oral Take 1 capsule (100 mg total) by mouth 2 (two) times daily. 20 capsule 0  . NEXIUM PO Oral Take by mouth.    Marland Kitchen FEXOFENADINE-PSEUDOEPHED ER 60-120 MG PO TB12 Oral Take 1 tablet by mouth every 12 (twelve) hours. 30 tablet  0  . TRAMADOL HCL 50 MG PO TABS Oral Take 1 tablet (50 mg total) by mouth every 8 (eight) hours as needed for pain. 90 tablet 0    BP 126/91  Pulse 89  Temp 98.3 F (36.8 C) (Oral)  Resp 16  Ht 5\' 9"  (1.753 m)  Wt 160 lb (72.576 kg)  BMI 23.63 kg/m2  SpO2 98%  Physical Exam  Nursing note and vitals reviewed. Constitutional: He is oriented to person, place, and time. He appears well-developed and well-nourished.  HENT:  Head: Normocephalic and atraumatic.  Right Ear: Hearing,  tympanic membrane and ear canal normal.  Left Ear: Hearing, tympanic membrane and ear canal normal.  Nose: Nose normal.  Mouth/Throat: Mucous membranes are normal. Mucous membranes are not dry. Posterior oropharyngeal erythema present. No oropharyngeal exudate, posterior oropharyngeal edema or tonsillar abscesses.  Eyes: Conjunctivae are normal. Right eye exhibits no discharge. Left eye exhibits no discharge.  Neck: Normal range of motion. Neck supple.       Patient moves neck well, no meningeal signs.   Cardiovascular: Normal rate, regular rhythm and normal heart sounds.   No murmur heard. Pulmonary/Chest: Effort normal and breath sounds normal. No respiratory distress. He has no wheezes. He has no rales.  Abdominal: Soft. There is tenderness in the epigastric area. There is no rebound, no guarding, no tenderness at McBurney's point and negative Murphy's sign.  Neurological: He is alert and oriented to person, place, and time. He has normal strength. No cranial nerve deficit or sensory deficit. Coordination normal.  Skin: Skin is warm and dry.       Mild circular rash on left lateral torso, light pink in color.   Psychiatric: He has a normal mood and affect.    ED Course  Procedures (including critical care time)   Labs Reviewed  CBC WITH DIFFERENTIAL  BASIC METABOLIC PANEL   No results found.   1. Rash   2. Viral syndrome     12:55 PM Patient seen and examined. Work-up initiated. Medications ordered.   Vital signs reviewed and are as follows: Filed Vitals:   08/13/11 1225  BP: 126/91  Pulse: 89  Temp: 98.3 F (36.8 C)  Resp: 16   4:05 PM Patient discussed and seen with Dr. Alto Denver. Labs are normal. Patient given fluids, pain medicine, headache cocktail. He appears well, non-toxic. Vitals are stable and normal. Course of doxycycline will be prolonged to a total of 21 days to treat for Lyme disease. Patient urged to return with worsening symptoms including high persistent  fever, persistent vomiting, trouble walking or talking, or if he has any other concerns. Urged patient to establish care with a primary care physician for further followup. Patient verbalizes understanding and agrees with plan.  Patient counseled on use of narcotic pain medications. Counseled not to combine these medications with others containing tylenol. Urged not to drink alcohol, drive, or perform any other activities that requires focus while taking these medications. The patient verbalizes understanding and agrees with the plan.   MDM  Patient with likely tick-borne illness. RMSF titer neg. Also consider ehrlichiosis, lyme disease. Continues to have constitutional symptoms however labs are normal, patient appears well hydrated, nontoxic. He does not have any meningeal signs. Do not suspect meningitis. Patient can continue supportive treatment at home and return if worsens. Patient urged to establish care with a primary care physician.       [place], [place] 08/13/11 [phone]

## 2011-08-13 NOTE — ED Provider Notes (Signed)
Medical screening examination/treatment/procedure(s) were conducted as a shared visit with non-physician practitioner(s) and myself.  I personally evaluated the patient during the encounter  Cyndra Numbers, MD 08/13/11 2354

## 2011-08-13 NOTE — ED Notes (Signed)
Spoke to pt given lab results. He states that his rash has improved however his other s/s have not improved. Advised the pt to f/u here or with his PCP for reevaluation. Pt agrees.

## 2011-08-13 NOTE — ED Notes (Signed)
Pt reports a two week hx of fever, generalized body aches, headache and sweats.  Seen at Urgent Care and started on Doxycycline.

## 2011-10-27 ENCOUNTER — Emergency Department (HOSPITAL_BASED_OUTPATIENT_CLINIC_OR_DEPARTMENT_OTHER): Payer: BC Managed Care – PPO

## 2011-10-27 ENCOUNTER — Emergency Department (HOSPITAL_BASED_OUTPATIENT_CLINIC_OR_DEPARTMENT_OTHER)
Admission: EM | Admit: 2011-10-27 | Discharge: 2011-10-27 | Disposition: A | Discharge status: Home / Self Care | Payer: BC Managed Care – PPO | Attending: Emergency Medicine | Admitting: Emergency Medicine

## 2011-10-27 ENCOUNTER — Encounter (HOSPITAL_BASED_OUTPATIENT_CLINIC_OR_DEPARTMENT_OTHER): Payer: Self-pay | Admitting: *Deleted

## 2011-10-27 DIAGNOSIS — Z79899 Other long term (current) drug therapy: Secondary | ICD-10-CM | POA: Insufficient documentation

## 2011-10-27 DIAGNOSIS — X500XXA Overexertion from strenuous movement or load, initial encounter: Secondary | ICD-10-CM | POA: Insufficient documentation

## 2011-10-27 DIAGNOSIS — M25562 Pain in left knee: Secondary | ICD-10-CM

## 2011-10-27 DIAGNOSIS — Y9289 Other specified places as the place of occurrence of the external cause: Secondary | ICD-10-CM | POA: Insufficient documentation

## 2011-10-27 DIAGNOSIS — Z96659 Presence of unspecified artificial knee joint: Secondary | ICD-10-CM | POA: Insufficient documentation

## 2011-10-27 DIAGNOSIS — M25569 Pain in unspecified knee: Secondary | ICD-10-CM | POA: Insufficient documentation

## 2011-10-27 MED ORDER — OXYCODONE-ACETAMINOPHEN 5-325 MG PO TABS: 2.0000 | ORAL_TABLET | ORAL | Status: DC | PRN

## 2011-10-27 MED ORDER — KETOROLAC TROMETHAMINE 60 MG/2ML IM SOLN
60.0000 mg | Freq: Once | INTRAMUSCULAR | Status: AC
  Administered 2011-10-27: 60 mg via INTRAMUSCULAR
  Filled 2011-10-27: qty 2

## 2011-10-27 MED ORDER — ONDANSETRON HCL 4 MG/2ML IJ SOLN
4.0000 mg | Freq: Once | INTRAMUSCULAR | Status: AC
  Administered 2011-10-27: 4 mg via INTRAMUSCULAR
  Filled 2011-10-27: qty 2

## 2011-10-27 MED ORDER — HYDROMORPHONE HCL PF 2 MG/ML IJ SOLN
2.0000 mg | Freq: Once | INTRAMUSCULAR | Status: AC
  Administered 2011-10-27: 2 mg via INTRAMUSCULAR
  Filled 2011-10-27: qty 1

## 2011-10-27 NOTE — ED Notes (Signed)
Pt states he injured his left knee about an hour ago. Stepped in hole. Hx knee replacement to this knee. +dpp palp. Moves toes. Feels touch.

## 2011-10-27 NOTE — ED Provider Notes (Signed)
History     CSN: [id number]  Arrival date & time 10/27/11  1641   First MD Initiated Contact with Patient 10/27/11 2001      Chief Complaint  Patient presents with  . Knee Injury    (Consider location/radiation/quality/duration/timing/severity/associated sxs/prior treatment) Patient is a 36 y.o. male presenting with knee pain. The history is provided by the patient. No language interpreter was used.  Knee Pain This is a new problem. The current episode started today. The problem occurs constantly. The problem has been unchanged. Associated symptoms include joint swelling. The symptoms are aggravated by bending and walking. He has tried nothing for the symptoms.  Pt reports he stepped in a hole and twisted knee.   Pt has had a knee replacement.   Pt complains of swelling and pain  Past Medical History  Diagnosis Date  . Collapsed lung     Past Surgical History  Procedure Date  . Joint replacement   . Knee surgery     x8- total knee  . Appendectomy     Family History  Problem Relation Age of Onset  . Cancer Other   . Sudden death Neg Hx   . Hyperlipidemia Neg Hx   . Heart attack Neg Hx   . Hypertension Neg Hx   . Diabetes Neg Hx     History  Substance Use Topics  . Smoking status: Current Every Day Smoker -- 20 years    Types: Cigarettes  . Smokeless tobacco: Not on file  . Alcohol Use: Yes     weekly      Review of Systems  Musculoskeletal: Positive for joint swelling and gait problem.  All other systems reviewed and are negative.    Allergies  Oxymorphone hcl; Prednisone; and Tylenol  Home Medications   Current Outpatient Rx  Name Route Sig Dispense Refill  . CIPROFLOXACIN HCL 0.3 % OP SOLN  1 drop every 2 (two) hours. Administer 1 drop, every 2 hours, while awake, for 2 days. Then 1 drop, every 4 hours, while awake, for the next 5 days.    Marland Kitchen DIPHENHYDRAMINE HCL 25 MG PO CAPS Oral Take 25 mg by mouth every 6 (six) hours as needed. For itching    .  FAMOTIDINE 10 MG PO CHEW Oral Chew 10 mg by mouth 2 (two) times daily as needed. For reflux    . FEXOFENADINE-PSEUDOEPHED ER 60-120 MG PO TB12 Oral Take 1 tablet by mouth every 12 (twelve) hours.    . IBUPROFEN 200 MG PO TABS Oral Take 600 mg by mouth every 6 (six) hours as needed. For pain    . OMEPRAZOLE 20 MG PO CPDR Oral Take 20 mg by mouth daily as needed. For reflux    . OXYCODONE-ACETAMINOPHEN 10-325 MG PO TABS Oral Take 1 tablet by mouth every 4 (four) hours as needed. For pain    . TRAMADOL HCL 50 MG PO TABS Oral Take 50 mg by mouth every 8 (eight) hours as needed. For pain      BP 130/93  Pulse 90  Temp 98.4 F (36.9 C) (Oral)  Resp 20  Ht 5\' 9"  (1.753 m)  Wt 155 lb (70.308 kg)  BMI 22.89 kg/m2  SpO2 100%  Physical Exam  Nursing note and vitals reviewed. Constitutional: He is oriented to person, place, and time. He appears well-developed and well-nourished.  Musculoskeletal: He exhibits tenderness.  Neurological: He is alert and oriented to person, place, and time. He has normal reflexes.  Skin: Skin  is warm.  Psychiatric: He has a normal mood and affect.    ED Course  Procedures (including critical care time)  Labs Reviewed - No data to display Dg Knee Complete 4 Views Left  10/27/2011  *RADIOLOGY REPORT*  Clinical Data: Knee injury.  Stepped in a hole.  LEFT KNEE - COMPLETE 4+ VIEW  Comparison: None.  Findings: The patient has had knee arthroplasty.  Joint effusion is present.  No evidence for acute fracture or loosening.  IMPRESSION: Joint effusion. Status post knee arthroplasty.   Original Report Authenticated By: Patterson Hammersmith, M.D.      1. Knee pain, left       MDM  Pt given dilaudid and zofran IM.     Pt reports Dr. Priscille Kluver has retired.  I gave pt referral to Orthopaedist on call.   Pt placed in a knee imbolizer.     Lonia Skinner [place], [place] 10/28/11 [phone]

## 2011-10-29 NOTE — ED Provider Notes (Signed)
Medical screening examination/treatment/procedure(s) were performed by non-physician practitioner and as supervising physician I was immediately available for consultation/collaboration.   Glover Capano, MD 10/29/11 0045 

## 2011-11-26 ENCOUNTER — Other Ambulatory Visit (HOSPITAL_COMMUNITY): Payer: Self-pay | Admitting: Orthopedic Surgery

## 2011-11-26 DIAGNOSIS — M25562 Pain in left knee: Secondary | ICD-10-CM

## 2011-11-28 ENCOUNTER — Ambulatory Visit (HOSPITAL_COMMUNITY)
Admission: RE | Admit: 2011-11-28 | Discharge: 2011-11-28 | Disposition: A | Discharge status: Home / Self Care | Payer: BC Managed Care – PPO | Source: Ambulatory Visit | Attending: Orthopedic Surgery | Admitting: Orthopedic Surgery

## 2011-11-28 ENCOUNTER — Encounter (HOSPITAL_COMMUNITY)
Admission: RE | Admit: 2011-11-28 | Discharge: 2011-11-28 | Disposition: A | Discharge status: Home / Self Care | Payer: BC Managed Care – PPO | Source: Ambulatory Visit | Attending: Orthopedic Surgery | Admitting: Orthopedic Surgery

## 2011-11-28 DIAGNOSIS — M25562 Pain in left knee: Secondary | ICD-10-CM

## 2011-11-28 DIAGNOSIS — Z96659 Presence of unspecified artificial knee joint: Secondary | ICD-10-CM | POA: Insufficient documentation

## 2011-11-28 DIAGNOSIS — M25569 Pain in unspecified knee: Secondary | ICD-10-CM | POA: Insufficient documentation

## 2011-11-28 DIAGNOSIS — M25469 Effusion, unspecified knee: Secondary | ICD-10-CM | POA: Insufficient documentation

## 2011-11-28 MED ORDER — TECHNETIUM TC 99M MEDRONATE IV KIT
25.0000 | PACK | Freq: Once | INTRAVENOUS | Status: AC | PRN
  Administered 2011-11-28: 25 via INTRAVENOUS

## 2012-01-07 ENCOUNTER — Ambulatory Visit (INDEPENDENT_AMBULATORY_CARE_PROVIDER_SITE_OTHER): Payer: BC Managed Care – PPO | Admitting: Family Medicine

## 2012-01-07 ENCOUNTER — Encounter: Payer: Self-pay | Admitting: Family Medicine

## 2012-01-07 VITALS — BP 127/77 | HR 92 | Ht 69.0 in | Wt 161.0 lb

## 2012-01-07 DIAGNOSIS — F411 Generalized anxiety disorder: Secondary | ICD-10-CM

## 2012-01-07 DIAGNOSIS — F419 Anxiety disorder, unspecified: Secondary | ICD-10-CM

## 2012-01-07 MED ORDER — BUSPIRONE HCL 10 MG PO TABS: 10.0000 mg | ORAL_TABLET | Freq: Three times a day (TID) | ORAL | Status: DC

## 2012-01-07 MED ORDER — HYDROXYZINE HCL 50 MG PO TABS: ORAL_TABLET | ORAL | Status: AC

## 2012-01-07 NOTE — Progress Notes (Addendum)
CC: Clifford Miller is a 36 y.o. male is here for Establish Care and Anxiety   Subjective: HPI:  Patient presents to establish care like to discuss anxiety. He describes anxiety attacks occurring most days of the week, he's been treating this with Xanax which has been provided from his wife's prescription. Triggers of his anxiety include declining marriage between him and his wife and job stressors. He's been dealing with anxiety ever since his teens, was using Xanax about a year ago. Had tried Wellbutrin but it turned him into a "a##hole". On a daily basis reports feeling nervous, trouble controlling the worrying, trouble relaxing, irritability, and worried about something awful about to happen but nothing in particular. Denies paranoia, hallucinations, mental disturbance, manic episodes, nor thoughts wanting to harm himself or others He currently sees a counselor, Dr. Elba Barman    Review of Systems - General ROS: negative for - chills, fever, night sweats, weight gain or weight loss Ophthalmic ROS: negative for - decreased vision ENT ROS: negative for - hearing change, nasal congestion, tinnitus or allergies Hematological and Lymphatic ROS: negative for - bleeding problems, bruising or swollen lymph nodes Breast ROS: negative Respiratory ROS: no cough, shortness of breath, or wheezing Cardiovascular ROS: no chest pain or dyspnea on exertion Gastrointestinal ROS: no abdominal pain, change in bowel habits, or black or bloody stools Genito-Urinary ROS: negative for - genital discharge, genital ulcers, incontinence or abnormal bleeding from genitals Musculoskeletal ROS: negative for - joint pain or muscle pain Neurological ROS: negative for - headaches or memory loss Dermatological ROS: negative for lumps, mole changes, rash and skin lesion changes  Past Medical History  Diagnosis Date  . Collapsed lung   . HIV infection      Family History  Problem Relation Age of Onset  . Cancer  Other   . Sudden death Neg Hx   . Hyperlipidemia Neg Hx   . Heart attack Neg Hx   . Hypertension Neg Hx   . Diabetes Neg Hx      History  Substance Use Topics  . Smoking status: Current Every Day Smoker -- 20 years    Types: Cigarettes  . Smokeless tobacco: Not on file  . Alcohol Use: Yes     Comment: weekly     Objective: Filed Vitals:   01/07/12 1530  BP: 127/77  Pulse: 92    General: Alert and Oriented, No Acute Distress HEENT: Pupils equal, round, reactive to light. Conjunctivae clear.  Moist mucous membranes  Lungs: Comfortable work of breathing Cardiac: Regular rate and rhythm.  Extremities: No peripheral edema.  Strong peripheral pulses.  Mental Status: No depression. Mild anxiety and agitation, restlessness. Well dressed, good eye contact. Skin: Warm and dry.  Assessment & Plan: Clifford Miller was seen today for establish care and anxiety.  Diagnoses and associated orders for this visit:  Anxiety - busPIRone (BUSPAR) 10 MG tablet; Take 1 tablet (10 mg total) by mouth 3 (three) times daily. - hydrOXYzine (ATARAX/VISTARIL) 50 MG tablet; Take one every 8 hours only as needed for anxiety.  Other Orders - ALPRAZolam (XANAX) 1 MG tablet; Take 1 mg by mouth at bedtime as needed.    Patient specifically requesting Xanax multiple times, he shows me roughly $10,000 in cash he plans to give his wife if she'll signed divorce papers tonight.  He offers me $200 cash to write him a prescription of Xanax, I kindly stated that this offer is inappropriate. Threatens that he'll get Xanax off the street it  I don't provide it today, I've encouraged him against this.  Encouraged him to start BuSpar, and hydroxyzine as needed.  Return in about 2 weeks (around 01/21/2012).

## 2012-01-12 ENCOUNTER — Encounter (HOSPITAL_COMMUNITY): Payer: Self-pay | Admitting: *Deleted

## 2012-01-12 ENCOUNTER — Emergency Department (HOSPITAL_COMMUNITY)
Admission: EM | Admit: 2012-01-12 | Discharge: 2012-01-12 | Disposition: A | Discharge status: Home / Self Care | Payer: BC Managed Care – PPO | Attending: Emergency Medicine | Admitting: Emergency Medicine

## 2012-01-12 ENCOUNTER — Emergency Department (HOSPITAL_COMMUNITY): Payer: BC Managed Care – PPO

## 2012-01-12 DIAGNOSIS — F17200 Nicotine dependence, unspecified, uncomplicated: Secondary | ICD-10-CM | POA: Insufficient documentation

## 2012-01-12 DIAGNOSIS — R110 Nausea: Secondary | ICD-10-CM | POA: Insufficient documentation

## 2012-01-12 DIAGNOSIS — Z8709 Personal history of other diseases of the respiratory system: Secondary | ICD-10-CM | POA: Insufficient documentation

## 2012-01-12 DIAGNOSIS — R51 Headache: Secondary | ICD-10-CM | POA: Insufficient documentation

## 2012-01-12 LAB — BASIC METABOLIC PANEL
Chloride: 98 mEq/L (ref 96–112)
GFR calc Af Amer: >90 mL/min (ref >90)
GFR calc non Af Amer: >90 mL/min (ref >90)
Potassium: 3.9 mEq/L (ref 3.5–5.1)

## 2012-01-12 LAB — CBC WITH DIFFERENTIAL/PLATELET
Basophils Absolute: 0 10*3/uL (ref 0.0–0.1)
Basophils Relative: 0 % (ref 0–1)
MCHC: 34.1 g/dL (ref 30.0–36.0)
Neutro Abs: 8.2 10*3/uL — ABNORMAL HIGH (ref 1.7–7.7)
Neutrophils Relative %: 70 % (ref 43–77)
Platelets: 279 10*3/uL (ref 150–400)
RDW: 13.7 % (ref 11.5–15.5)

## 2012-01-12 MED ORDER — PSEUDOEPHEDRINE HCL 60 MG PO TABS: ORAL_TABLET | ORAL | Status: AC

## 2012-01-12 MED ORDER — OXYCODONE HCL 5 MG PO TABS: 5.0000 mg | ORAL_TABLET | Freq: Four times a day (QID) | ORAL | Status: AC | PRN

## 2012-01-12 MED ORDER — ONDANSETRON HCL 4 MG PO TABS: 4.0000 mg | ORAL_TABLET | Freq: Once | ORAL | Status: DC

## 2012-01-12 MED ORDER — ONDANSETRON 4 MG PO TBDP
ORAL_TABLET | ORAL | Status: AC
  Administered 2012-01-12: 4 mg via ORAL
  Filled 2012-01-12: qty 1

## 2012-01-12 MED ORDER — ONDANSETRON 4 MG PO TBDP
4.0000 mg | ORAL_TABLET | Freq: Once | ORAL | Status: AC
  Administered 2012-01-12: 4 mg via ORAL

## 2012-01-12 MED ORDER — SULFAMETHOXAZOLE-TRIMETHOPRIM 800-160 MG PO TABS: 1.0000 | ORAL_TABLET | Freq: Two times a day (BID) | ORAL | Status: AC

## 2012-01-12 NOTE — ED Notes (Signed)
Pt requested zofran for nausea. PA aware. And vo received zofran 4mg  odt read back and verified

## 2012-01-12 NOTE — ED Notes (Signed)
bil eye pain with headache and nausea since yesterday.  Denies drainage.  Reports dizziness, denies visual disturbances.

## 2012-01-12 NOTE — ED Provider Notes (Signed)
Medical screening examination/treatment/procedure(s) were performed by non-physician practitioner and as supervising physician I was immediately available for consultation/collaboration.   Dione Booze, MD 01/12/12 [phone]

## 2012-01-12 NOTE — ED Provider Notes (Signed)
History     CSN: [id number]  Arrival date & time 01/12/12  4098   First MD Initiated Contact with Patient 01/12/12 [phone]      Chief Complaint  Patient presents with  . Eye Pain    (Consider location/radiation/quality/duration/timing/severity/associated sxs/prior treatment) HPI Comments: Patient states he's been doing just fine until yesterday December 20 at which time he began to have bilateral eye pain and headache. 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 He states that he has been told to keep his fingers out of his nose but he finds himself picking at his nose a lot. He has not had any bleeding, she's not had any drainage or discharge. Patient states he has some sinus related problems from time to time but that this pain feels more intense than a sinus related headache. The patient has not had any injury to the head of, no recent surgery to the head. It is of note that the patient has been diagnosed with HIV. There's been no high fevers. No visual changes, but some mild nausea present. Patient has tried Tylenol without any relief.  The history is provided by the patient.    Past Medical History  Diagnosis Date  . Collapsed lung     Past Surgical History  Procedure Date  . Joint replacement   . Knee surgery     x8- total knee  . Appendectomy     Family History  Problem Relation Age of Onset  . Cancer Other   . Sudden death Neg Hx   . Hyperlipidemia Neg Hx   . Heart attack Neg Hx   . Hypertension Neg Hx   . Diabetes Neg Hx     History  Substance Use Topics  . Smoking status: Current Every Day Smoker -- 20 years    Types: Cigarettes  . Smokeless tobacco: Not on file  . Alcohol Use: Yes     Comment: weekly      Review of Systems  Constitutional: Negative for activity change.       All ROS Neg except as noted in HPI  HENT: Negative for nosebleeds and  neck pain.   Eyes: Positive for pain. Negative for photophobia and discharge.  Respiratory: Negative for cough, shortness of breath and wheezing.   Cardiovascular: Negative for chest pain and palpitations.  Gastrointestinal: Positive for nausea. Negative for abdominal pain and blood in stool.  Genitourinary: Negative for dysuria, frequency and hematuria.  Musculoskeletal: Negative for back pain and arthralgias.  Skin: Negative.   Neurological: Positive for headaches. Negative for dizziness, seizures and speech difficulty.  Psychiatric/Behavioral: Negative for hallucinations and confusion.    Allergies  Oxymorphone hcl; Prednisone; and Tylenol  Home Medications   Current Outpatient Rx  Name  Route  Sig  Dispense  Refill  . BUSPIRONE HCL 10 MG PO TABS   Oral   Take 1 tablet (10 mg total) by mouth 3 (three) times daily.   60 tablet   1   . HYDROXYZINE HCL 50 MG PO TABS      Take one every 8 hours only as needed for anxiety.   30 tablet   1     BP 112/66  Pulse 95  Temp 98.4 F (36.9 C) (Oral)  Resp 16  Ht 5\' 9"  (1.753 m)  Wt 165 lb (74.844 kg)  BMI 24.37 kg/m2  SpO2 97%  Physical Exam  Nursing note and vitals reviewed. Constitutional: He is oriented to person, place, and time. He appears well-developed and well-nourished.  Non-toxic appearance.  HENT:  Head: Normocephalic.  Right Ear: Tympanic membrane and external ear normal.  Left Ear: Tympanic membrane and external ear normal.       Few scabbed areas in both nostrils. No bleeding. No discharge.  Eyes: EOM and lids are normal. Pupils are equal, round, and reactive to light. No foreign bodies found. Right eye exhibits no discharge. No foreign body present in the right eye. Left eye exhibits no discharge. No foreign body present in the left eye. Right conjunctiva is not injected. Left conjunctiva is not injected. No scleral icterus.  Fundoscopic exam:      The right eye shows no arteriolar narrowing, no AV nicking,  no exudate and no papilledema.       The left eye shows no arteriolar narrowing, no AV nicking, no exudate and no papilledema.  Neck: Normal range of motion. Neck supple. Carotid bruit is not present.  Cardiovascular: Normal rate, regular rhythm, normal heart sounds, intact distal pulses and normal pulses.   Pulmonary/Chest: Breath sounds normal. No respiratory distress.  Abdominal: Soft. Bowel sounds are normal. There is no tenderness. There is no guarding.  Musculoskeletal: Normal range of motion.  Lymphadenopathy:       Head (right side): No submandibular adenopathy present.       Head (left side): No submandibular adenopathy present.    He has no cervical adenopathy.  Neurological: He is alert and oriented to person, place, and time. He has normal strength. No cranial nerve deficit or sensory deficit.  Skin: Skin is warm and dry.  Psychiatric: He has a normal mood and affect. His speech is normal.    ED Course  Procedures (including critical care time)  Labs Reviewed - No data to display No results found.   No diagnosis found.    MDM  I have reviewed nursing notes, vital signs, and all appropriate lab and imaging results for this patient. It is listed on the patient's chart that he has an HIV infection. The patient states that he does not have HIV and has never had or been diagnosed with HIV. The basic metabolic panel well within normal limits. The complete blood count shows a white blood cell count slightly elevated at 11.7 the remainder is well within normal limits. The CT scan is negative for any acute problems. Patient been made aware of the examination findings. The plan at this time is to use Sudafed 3 times daily for congestion, Septra 2 times daily, and Roxicodone one every 6 hours as needed for pain. Patient is to see his primary doctor or return to the emergency department if any changes, problems, or concerns.        Kathie Dike, [place] 01/12/12 [phone]

## 2013-04-27 ENCOUNTER — Emergency Department (HOSPITAL_COMMUNITY)
Admission: EM | Admit: 2013-04-27 | Discharge: 2013-04-27 | Disposition: A | Discharge status: Home / Self Care | Payer: 59 | Attending: Emergency Medicine | Admitting: Emergency Medicine

## 2013-04-27 ENCOUNTER — Encounter (HOSPITAL_COMMUNITY): Payer: Self-pay | Admitting: Emergency Medicine

## 2013-04-27 ENCOUNTER — Emergency Department (HOSPITAL_COMMUNITY): Payer: 59

## 2013-04-27 DIAGNOSIS — S99929A Unspecified injury of unspecified foot, initial encounter: Principal | ICD-10-CM

## 2013-04-27 DIAGNOSIS — S8990XA Unspecified injury of unspecified lower leg, initial encounter: Secondary | ICD-10-CM | POA: Insufficient documentation

## 2013-04-27 DIAGNOSIS — Y9229 Other specified public building as the place of occurrence of the external cause: Secondary | ICD-10-CM | POA: Insufficient documentation

## 2013-04-27 DIAGNOSIS — M25561 Pain in right knee: Secondary | ICD-10-CM

## 2013-04-27 DIAGNOSIS — X500XXA Overexertion from strenuous movement or load, initial encounter: Secondary | ICD-10-CM | POA: Insufficient documentation

## 2013-04-27 DIAGNOSIS — Z96659 Presence of unspecified artificial knee joint: Secondary | ICD-10-CM | POA: Insufficient documentation

## 2013-04-27 DIAGNOSIS — Z79899 Other long term (current) drug therapy: Secondary | ICD-10-CM | POA: Insufficient documentation

## 2013-04-27 DIAGNOSIS — Y990 Civilian activity done for income or pay: Secondary | ICD-10-CM | POA: Insufficient documentation

## 2013-04-27 DIAGNOSIS — S99919A Unspecified injury of unspecified ankle, initial encounter: Principal | ICD-10-CM

## 2013-04-27 DIAGNOSIS — F17200 Nicotine dependence, unspecified, uncomplicated: Secondary | ICD-10-CM | POA: Insufficient documentation

## 2013-04-27 DIAGNOSIS — M25469 Effusion, unspecified knee: Secondary | ICD-10-CM | POA: Insufficient documentation

## 2013-04-27 MED ORDER — HYDROCODONE-IBUPROFEN 7.5-200 MG PO TABS: 1.0000 | ORAL_TABLET | Freq: Four times a day (QID) | ORAL | Status: AC | PRN

## 2013-04-27 MED ORDER — IBUPROFEN 800 MG PO TABS
800.0000 mg | ORAL_TABLET | Freq: Once | ORAL | Status: AC
Start: 2013-04-27 — End: 2013-04-27
  Administered 2013-04-27: 800 mg via ORAL
  Filled 2013-04-27: qty 1

## 2013-04-27 NOTE — ED Provider Notes (Signed)
CSN: [id number]     Arrival date & time 04/27/13  [id number] History   First MD Initiated Contact with Patient 04/27/13 1006     Chief Complaint  Patient presents with  . Knee Pain     (Consider location/radiation/quality/duration/timing/severity/associated sxs/prior Treatment) HPI Comments: Patient is a 38 year old male past medical history significant for tobacco use, left knee replacement presenting to the emergency department for right knee pain that began Thursday while at work. Patient states he twisted his knee causing severe pain to the posterior portion of his knee with radiation to for dyspnea and down to the foot. States it's burning in nature. History of Aleve and ice without any improvement. He states ambulating and bending worsen his pain. Patient is seen at [facility] [place].   Patient is a 38 y.o. male presenting with knee pain.  Knee Pain Associated symptoms: no fever     Past Medical History  Diagnosis Date  . Collapsed lung    Past Surgical History  Procedure Laterality Date  . Joint replacement    . Knee surgery      x8- total knee  . Appendectomy     Family History  Problem Relation Age of Onset  . Cancer Other   . Sudden death Neg Hx   . Hyperlipidemia Neg Hx   . Heart attack Neg Hx   . Hypertension Neg Hx   . Diabetes Neg Hx    History  Substance Use Topics  . Smoking status: Current Every Day Smoker -- 20 years    Types: Cigarettes  . Smokeless tobacco: Not on file  . Alcohol Use: Yes     Comment: weekly    Review of Systems  Constitutional: Negative for fever and chills.  Musculoskeletal: Positive for arthralgias, joint swelling and myalgias.  Neurological: Negative for weakness and numbness.  All other systems reviewed and are negative.      Allergies  Oxymorphone hcl; Prednisone; and Tylenol  Home Medications   Current Outpatient Rx  Name  Route  Sig  Dispense  Refill  . busPIRone (BUSPAR) 10 MG tablet   Oral   Take 1  tablet (10 mg total) by mouth 3 (three) times daily.   60 tablet   1   . HYDROcodone-ibuprofen (VICOPROFEN) 7.5-200 MG per tablet   Oral   Take 1 tablet by mouth every 6 (six) hours as needed for severe pain.   15 tablet   0   . hydrOXYzine (ATARAX/VISTARIL) 50 MG tablet      Take one every 8 hours only as needed for anxiety.   30 tablet   1   . oxyCODONE (ROXICODONE) 5 MG immediate release tablet   Oral   Take 1 tablet (5 mg total) by mouth every 6 (six) hours as needed for pain.   15 tablet   0   . pseudoephedrine (SUDAFED) 60 MG tablet      1 po tid for congestion   30 tablet   0   . sulfamethoxazole-trimethoprim (SEPTRA DS) 800-160 MG per tablet   Oral   Take 1 tablet by mouth 2 (two) times daily.   14 tablet   0    BP 91/72  Pulse 100  Temp(Src) 98.2 F (36.8 C)  Resp 16  SpO2 100% Physical Exam  Nursing note and vitals reviewed. Constitutional: He is oriented to person, place, and time. He appears well-developed and well-nourished. No distress.  HENT:  Head: Normocephalic and atraumatic.  Right Ear: External  ear normal.  Left Ear: External ear normal.  Nose: Nose normal.  Mouth/Throat: Oropharynx is clear and moist.  Eyes: Conjunctivae are normal.  Neck: Normal range of motion. Neck supple.  Cardiovascular: Normal rate, regular rhythm, normal heart sounds and intact distal pulses.   Pulmonary/Chest: Effort normal.  Abdominal: Soft.  Musculoskeletal:       Right knee: He exhibits decreased range of motion. He exhibits no swelling, no effusion, no deformity, no laceration, no erythema and normal alignment. Tenderness found. Patellar tendon tenderness noted.       Left knee: Normal.       Right ankle: Normal.       Left ankle: Normal.       Right upper leg: Normal.       Left upper leg: Normal.       Right lower leg: Normal.       Left lower leg: Normal.       Right foot: Normal.       Left foot: Normal.  Neurological: He is alert and oriented to  person, place, and time.  Sensation grossly intact.  Skin: Skin is warm and dry. He is not diaphoretic.  Psychiatric: He has a normal mood and affect.    ED Course  Procedures (including critical care time) Medications  ibuprofen (ADVIL,MOTRIN) tablet 800 mg (800 mg Oral Given 04/27/13 1032)    Labs Review Labs Reviewed - No data to display Imaging Review Dg Knee Complete 4 Views Right  04/27/2013   CLINICAL DATA:  Right knee pain for 5 days  EXAM: RIGHT KNEE - COMPLETE 4+ VIEW  COMPARISON:  11/23/2011  FINDINGS: Five views of the right knee submitted. No acute fracture or subluxation. No joint effusion.  IMPRESSION: Negative.   Electronically Signed   By: Natasha Mead M.D.   On: 04/27/2013 10:18     EKG Interpretation None      MDM   Final diagnoses:  Right knee pain    Filed Vitals:   04/27/13 1036  BP: 91/72  Pulse: 100  Temp: 98.2 F (36.8 C)  Resp: 16   Afebrile, NAD, non-toxic appearing, AAOx4. Neurovascularly intact. Normal sensation. Imaging shows no fracture. Directed pt to ice injury, and take Motrin for mild to moderate pain and swelling, will provide pain medication. Advised to use crutches and Ace wrap as given in the emergency department. Advised to follow up with his orthopedist. Return precautions discussed. Patient agreeable to plan.  Jeannetta Ellis, PA-C 04/27/13 2001

## 2013-04-27 NOTE — Discharge Instructions (Signed)
Please [facility]. Please use crutches and ace wrap as advised. Please take pain medication and/or muscle relaxants as prescribed and as needed for pain. Please do not drive on narcotic pain medication or on muscle relaxants. Please follow RICE method below. Please read all discharge instructions and return precautions.   Knee Pain Knee pain can be a result of an injury or other medical conditions. Treatment will depend on the cause of your pain. HOME CARE  Only take medicine as told by your doctor.  Keep a healthy weight. Being overweight can make the knee hurt more.  Stretch before exercising or playing sports.  If there is constant knee pain, change the way you exercise. Ask your doctor for advice.  Make sure shoes fit well. Choose the right shoe for the sport or activity.  Protect your knees. Wear kneepads if needed.  Rest when you are tired. GET HELP RIGHT AWAY IF:   Your knee pain does not stop.  Your knee pain does not get better.  Your knee joint feels hot to the touch.  You have a fever. MAKE SURE YOU:   Understand these instructions.  Will watch this condition.  Will get help right away if you are not doing well or get worse. Document Released: 04/06/2008 Document Revised: 04/02/2011 Document Reviewed: 04/06/2008 [facility] Patient Information 2014 [employer] [place], [place].  RICE: Routine Care for Injuries The routine care of many injuries includes Rest, Ice, Compression, and Elevation (RICE). HOME CARE INSTRUCTIONS  Rest is needed to allow your body to heal. Routine activities can usually be resumed when comfortable. Injured tendons and bones can take up to 6 weeks to heal. Tendons are the cord-like structures that attach muscle to bone.  Ice following an injury helps keep the swelling down and reduces pain.  Put ice in a plastic bag.  Place a towel between your skin and the bag.  Leave the ice on for 15-20 minutes, 03-04 times  a day. Do this while awake, for the first 24 to 48 hours. After that, continue as directed by your caregiver.  Compression helps keep swelling down. It also gives support and helps with discomfort. If an elastic bandage has been applied, it should be removed and reapplied every 3 to 4 hours. It should not be applied tightly, but firmly enough to keep swelling down. Watch fingers or toes for swelling, bluish discoloration, coldness, numbness, or excessive pain. If any of these problems occur, remove the bandage and reapply loosely. Contact your caregiver if these problems continue.  Elevation helps reduce swelling and decreases pain. With extremities, such as the arms, hands, legs, and feet, the injured area should be placed near or above the level of the heart, if possible. SEEK IMMEDIATE MEDICAL CARE IF:  You have persistent pain and swelling.  You develop redness, numbness, or unexpected weakness.  Your symptoms are getting worse rather than improving after several days. These symptoms may indicate that further evaluation or further X-rays are needed. Sometimes, X-rays may not show a small broken bone (fracture) until 1 week or 10 days later. Make a follow-up appointment with your caregiver. Ask when your X-ray results will be ready. Make sure you get your X-ray results. Document Released: 04/22/2000 Document Revised: 04/02/2011 Document Reviewed: 06/09/2010 [facility] Patient Information 2014 [place], [place].

## 2013-04-27 NOTE — ED Notes (Addendum)
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

## 2013-04-30 NOTE — ED Provider Notes (Signed)
Medical screening examination/treatment/procedure(s) were performed by non-physician practitioner and as supervising physician I was immediately available for consultation/collaboration.   EKG Interpretation None        Nelsie Domino H Marchel Foote, MD 04/30/13 0806 

## 2013-07-20 IMAGING — NM NM BONE 3 PHASE
2 series · 12 of 12 positions shown · non-contrast
Comparison: Left knee radiographs 10/27/2011 and 10/29/2011.  Right
knee MRI 11/23/2011.

CLINICAL DATA: Left knee pain and swelling.  History of left total
knee arthroplasty 07/06/2010.

NUCLEAR MEDICINE THREE PHASE BONE SCAN
TECHNIQUE: Radionuclide angiographic images, immediate static
blood pool images, and 3-hour delayed static images of the knees
were obtained after intravenous injection of radiopharmaceutical.
Radiopharmaceutical: EZWFEEF AFRAM IBRAHIM DOUGUET TECHNETIUM TC 99M
MEDRONATE IV KIT

[Series 1: fl flow and static · 4.75mm/px · 6 of 40 frames shown (1 of 2)]
[frame 4/40  full-range]
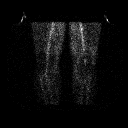
[frame 10/40  full-range]
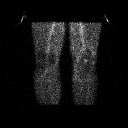
[frame 17/40  full-range]
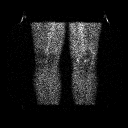
[frame 24/40  full-range]
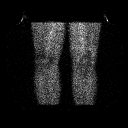
[frame 30/40  full-range]
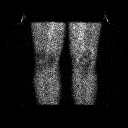
[frame 37/40  full-range]
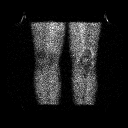

[Series 1: fl flow and static · 4.75mm/px · 6 of 40 frames shown (2 of 2)]
[frame 4/40  full-range]
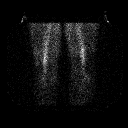
[frame 10/40  full-range]
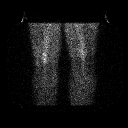
[frame 17/40  full-range]
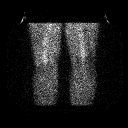
[frame 24/40  full-range]
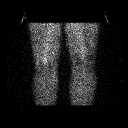
[frame 30/40  full-range]
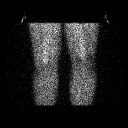
[frame 37/40  full-range]
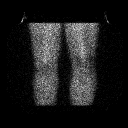

[12 of 12 positions shown; findings below may reference images not displayed]

FINDINGS: The dynamic images are normal with symmetric perfusion to
both knees. The blood pool and delayed phase images demonstrate
minimal activity surrounding the left total knee arthroplasty,
within physiologic limits.  The right knee activity is normal.
IMPRESSION: Low-level blood pool and delayed phase activity surrounding the
left total knee arthroplasty is within physiologic limits.  No
evidence of infection or loosening.  The right knee demonstrates no
abnormality.

## 2017-01-25 ENCOUNTER — Emergency Department (HOSPITAL_BASED_OUTPATIENT_CLINIC_OR_DEPARTMENT_OTHER)
Admission: EM | Admit: 2017-01-25 | Discharge: 2017-01-25 | Disposition: A | Discharge status: Home / Self Care | Payer: 59 | Attending: Emergency Medicine | Admitting: Emergency Medicine

## 2017-01-25 ENCOUNTER — Encounter (HOSPITAL_BASED_OUTPATIENT_CLINIC_OR_DEPARTMENT_OTHER): Payer: Self-pay | Admitting: *Deleted

## 2017-01-25 ENCOUNTER — Emergency Department (HOSPITAL_BASED_OUTPATIENT_CLINIC_OR_DEPARTMENT_OTHER): Payer: 59

## 2017-01-25 ENCOUNTER — Other Ambulatory Visit: Payer: Self-pay

## 2017-01-25 DIAGNOSIS — S20212A Contusion of left front wall of thorax, initial encounter: Secondary | ICD-10-CM | POA: Insufficient documentation

## 2017-01-25 DIAGNOSIS — F17210 Nicotine dependence, cigarettes, uncomplicated: Secondary | ICD-10-CM | POA: Diagnosis not present

## 2017-01-25 DIAGNOSIS — Z79899 Other long term (current) drug therapy: Secondary | ICD-10-CM | POA: Insufficient documentation

## 2017-01-25 DIAGNOSIS — Y999 Unspecified external cause status: Secondary | ICD-10-CM | POA: Insufficient documentation

## 2017-01-25 DIAGNOSIS — Y939 Activity, unspecified: Secondary | ICD-10-CM | POA: Diagnosis not present

## 2017-01-25 DIAGNOSIS — Y929 Unspecified place or not applicable: Secondary | ICD-10-CM | POA: Diagnosis not present

## 2017-01-25 DIAGNOSIS — X58XXXA Exposure to other specified factors, initial encounter: Secondary | ICD-10-CM | POA: Diagnosis not present

## 2017-01-25 DIAGNOSIS — S299XXA Unspecified injury of thorax, initial encounter: Secondary | ICD-10-CM | POA: Diagnosis present

## 2017-01-25 DIAGNOSIS — Z96659 Presence of unspecified artificial knee joint: Secondary | ICD-10-CM | POA: Diagnosis not present

## 2017-01-25 MED ORDER — METHOCARBAMOL 500 MG PO TABS: 500.0000 mg | ORAL_TABLET | Freq: Two times a day (BID) | ORAL | 0 refills | Status: AC
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
## 2017-01-25 NOTE — ED Triage Notes (Signed)
Pt c/o left rib injury x 1 day ago

## 2017-01-25 NOTE — Discharge Instructions (Signed)
Take Robaxin twice daily prescribed as needed for muscle pain or spasms.  Do not drive or operate machinery while taking this medication.  Use ice 3-4 times daily alternating 20 minutes on, 20 minutes off.  Use incentive spirometer several times a day as you had been trained in the past.  Please return to the emergency department if you develop any new or worsening symptoms including worsening shortness of breath, pain, fever, or any other new or concerning symptoms.
